# Patient Record
Sex: Female | Born: 1965 | Hispanic: No | Marital: Married | Smoking: Never smoker
Health system: Southern US, Community
[De-identification: ages and names within clinical notes are randomized; demographics above are authoritative.]

## PROBLEM LIST (undated history)

## (undated) DIAGNOSIS — E785 Hyperlipidemia, unspecified: Secondary | ICD-10-CM

## (undated) DIAGNOSIS — E119 Type 2 diabetes mellitus without complications: Secondary | ICD-10-CM

## (undated) DIAGNOSIS — Z9289 Personal history of other medical treatment: Secondary | ICD-10-CM

## (undated) DIAGNOSIS — K219 Gastro-esophageal reflux disease without esophagitis: Secondary | ICD-10-CM

## (undated) DIAGNOSIS — Z8249 Family history of ischemic heart disease and other diseases of the circulatory system: Secondary | ICD-10-CM

## (undated) HISTORY — DX: Type 2 diabetes mellitus without complications: E11.9

## (undated) HISTORY — DX: Personal history of other medical treatment: Z92.89

## (undated) HISTORY — DX: Morbid (severe) obesity due to excess calories: E66.01

## (undated) HISTORY — DX: Gastro-esophageal reflux disease without esophagitis: K21.9

## (undated) HISTORY — DX: Family history of ischemic heart disease and other diseases of the circulatory system: Z82.49

## (undated) HISTORY — DX: Hyperlipidemia, unspecified: E78.5

---

## 2015-12-09 ENCOUNTER — Ambulatory Visit (INDEPENDENT_AMBULATORY_CARE_PROVIDER_SITE_OTHER): Payer: BLUE CROSS/BLUE SHIELD | Admitting: Physician Assistant

## 2015-12-09 VITALS — BP 120/78 | HR 94 | Temp 98.0°F | Resp 18 | Ht 61.0 in | Wt 240.0 lb

## 2015-12-09 DIAGNOSIS — Z131 Encounter for screening for diabetes mellitus: Secondary | ICD-10-CM | POA: Diagnosis not present

## 2015-12-09 DIAGNOSIS — Z1239 Encounter for other screening for malignant neoplasm of breast: Secondary | ICD-10-CM | POA: Diagnosis not present

## 2015-12-09 DIAGNOSIS — Z1389 Encounter for screening for other disorder: Secondary | ICD-10-CM

## 2015-12-09 DIAGNOSIS — Z760 Encounter for issue of repeat prescription: Secondary | ICD-10-CM

## 2015-12-09 DIAGNOSIS — Z130 Encounter for screening for diseases of the blood and blood-forming organs and certain disorders involving the immune mechanism: Secondary | ICD-10-CM

## 2015-12-09 DIAGNOSIS — Z1159 Encounter for screening for other viral diseases: Secondary | ICD-10-CM | POA: Diagnosis not present

## 2015-12-09 DIAGNOSIS — F329 Major depressive disorder, single episode, unspecified: Secondary | ICD-10-CM

## 2015-12-09 DIAGNOSIS — Z1211 Encounter for screening for malignant neoplasm of colon: Secondary | ICD-10-CM

## 2015-12-09 DIAGNOSIS — Z124 Encounter for screening for malignant neoplasm of cervix: Secondary | ICD-10-CM

## 2015-12-09 DIAGNOSIS — Z114 Encounter for screening for human immunodeficiency virus [HIV]: Secondary | ICD-10-CM

## 2015-12-09 DIAGNOSIS — Z13220 Encounter for screening for lipoid disorders: Secondary | ICD-10-CM | POA: Diagnosis not present

## 2015-12-09 DIAGNOSIS — Z0000 Encounter for general adult medical examination without abnormal findings: Secondary | ICD-10-CM

## 2015-12-09 DIAGNOSIS — Z1329 Encounter for screening for other suspected endocrine disorder: Secondary | ICD-10-CM | POA: Diagnosis not present

## 2015-12-09 DIAGNOSIS — F32A Depression, unspecified: Secondary | ICD-10-CM

## 2015-12-09 LAB — POCT WET + KOH PREP
TRICH BY WET PREP: ABSENT
YEAST BY KOH: ABSENT
YEAST BY WET PREP: ABSENT

## 2015-12-09 MED ORDER — OMEPRAZOLE 40 MG PO CPDR: 40.0000 mg | DELAYED_RELEASE_CAPSULE | Freq: Every day | ORAL | Status: DC

## 2015-12-09 MED ORDER — CITALOPRAM HYDROBROMIDE 20 MG PO TABS: 20.0000 mg | ORAL_TABLET | Freq: Every day | ORAL | Status: DC

## 2015-12-09 NOTE — Patient Instructions (Signed)
     IF you received an x-ray today, you will receive an invoice from [facility]. Please contact [facility] at [phone] with questions or concerns regarding your invoice.   IF you received labwork today, you will receive an invoice from [facility]. Please contact [facility] at [phone] with questions or concerns regarding your invoice.   Our billing staff will not be able to assist you with questions regarding bills from these companies.  You will be contacted with the lab results as soon as they are available. The fastest way to get your results is to activate your My Chart account. Instructions are located on the last page of this paperwork. If you have not heard from us regarding the results in 2 weeks, please contact this office.      

## 2015-12-09 NOTE — Progress Notes (Signed)
12/09/2015 7:45 PM   DOB: 11/21/1965 / MRN: [id number]  SUBJECTIVE:  Alexis Stark is a 50 y.o. female presenting for an annual physical.  She has not been receiving regular care.  She denies any problems today and she is really here to receive the discount on her insurance program.    She does complain of anhedonia and dysthymia today. She associates poor concentration, excessive appetite and excessive sleep.  She has been depressed in the past and reports that Celexa helped her greatly with her symptoms.  She denies SI/HI.  She would like to go back on the Celexa.   She would like a refill of omeprazole today.   She has no allergies on file.   She  has no past medical history on file.    She  reports that she has never smoked. She does not have any smokeless tobacco history on file. She  has no sexual activity history on file. The patient  has no past surgical history on file.  Her family history is not on file.  Review of Systems  Constitutional: Negative for fever and chills.  Eyes: Negative for blurred vision.  Respiratory: Negative for cough and shortness of breath.   Cardiovascular: Negative for chest pain.  Gastrointestinal: Negative for nausea and abdominal pain.  Genitourinary: Negative for dysuria, urgency and frequency.  Musculoskeletal: Negative for myalgias.  Skin: Negative for rash.  Neurological: Negative for dizziness, tingling and headaches.  Psychiatric/Behavioral: Negative for depression. The patient is not nervous/anxious.     Problem list and medications reviewed and updated by myself where necessary, and exist elsewhere in the encounter.   OBJECTIVE:  BP 120/78 mmHg  Pulse 94  Temp(Src) 98 F (36.7 C) (Oral)  Resp 18  Ht 5\' 1"  (1.549 m)  Wt 240 lb (108.863 kg)  BMI 45.37 kg/m2  SpO2 97%  LMP 11/25/2015  Physical Exam  Constitutional: She is oriented to person, place, and time. She appears well-nourished. No distress.  She is morbidly obese    Eyes: EOM are normal. Pupils are equal, round, and reactive to light.  Cardiovascular: Normal rate.   Pulmonary/Chest: Effort normal.  Abdominal: She exhibits no distension.  Neurological: She is alert and oriented to person, place, and time. No cranial nerve deficit. Gait normal.  Skin: Skin is dry. She is not diaphoretic.  Psychiatric: She has a normal mood and affect.  Vitals reviewed.   No results found for this or any previous visit (from the past 72 hour(s)).  No results found.  ASSESSMENT AND PLAN  Alexis Stark was seen today for medical clearance.  Diagnoses and all orders for this visit:  Screening, anemia, deficiency, iron  Annual physical exam -     POCT Wet + KOH Prep  Diabetes mellitus screening -     Hemoglobin A1c  Lipid screening -     Lipid panel  Thyroid disorder screen -     TSH  Screening for cervical cancer -     Pap IG, CT/NG w/ reflex HPV when ASC-U  Special screening for malignant neoplasms, colon -     Ambulatory referral to Gastroenterology  Need for hepatitis C screening test -     Hepatitis C antibody  Screening for HIV (human immunodeficiency virus) -     HIV antibody  Screening for breast cancer -     MM DIGITAL SCREENING BILATERAL; Future  Screening for nephropathy -     COMPLETE METABOLIC PANEL WITH GFR  Medication refill -  omeprazole (PRILOSEC) 40 MG capsule; Take 1 capsule (40 mg total) by mouth daily.  Depression (emotion) -     citalopram (CELEXA) 20 MG tablet; Take 1 tablet (20 mg total) by mouth daily.    The patient was advised to call or return to clinic if she does not see an improvement in symptoms or to seek the care of the closest emergency department if she worsens with the above plan.   Deliah BostonMichael Clark, MHS, PA-C [facility] 12/09/2015 7:45 PM

## 2015-12-10 LAB — COMPLETE METABOLIC PANEL WITH GFR
ALT: 12 U/L (ref 6–29)
AST: 10 U/L (ref 10–35)
Albumin: 3.6 g/dL (ref 3.6–5.1)
Alkaline Phosphatase: 79 U/L (ref 33–130)
BILIRUBIN TOTAL: 0.2 mg/dL (ref 0.2–1.2)
BUN: 17 mg/dL (ref 7–25)
CO2: 24 mmol/L (ref 20–31)
CREATININE: 0.76 mg/dL (ref 0.50–1.05)
Calcium: 9.2 mg/dL (ref 8.6–10.4)
Chloride: 101 mmol/L (ref 98–110)
GFR, Est African American: >89 mL/min (ref >=60)
GFR, Est Non African American: >89 mL/min (ref >=60)
GLUCOSE: 157 mg/dL — AB (ref 65–99)
Potassium: 4.4 mmol/L (ref 3.5–5.3)
SODIUM: 136 mmol/L (ref 135–146)
TOTAL PROTEIN: 6.7 g/dL (ref 6.1–8.1)

## 2015-12-10 LAB — LIPID PANEL
CHOL/HDL RATIO: 4 ratio (ref <=5.0)
Cholesterol: 242 mg/dL — ABNORMAL HIGH (ref 125–200)
HDL: 61 mg/dL (ref >=46)
LDL CALC: 146 mg/dL — AB (ref <130)
TRIGLYCERIDES: 176 mg/dL — AB (ref <150)
VLDL: 35 mg/dL — ABNORMAL HIGH (ref <30)

## 2015-12-10 LAB — TSH: TSH: 1.37 mIU/L

## 2015-12-10 LAB — HIV ANTIBODY (ROUTINE TESTING W REFLEX): HIV 1&2 Ab, 4th Generation: NONREACTIVE

## 2015-12-10 LAB — HEPATITIS C ANTIBODY: HCV Ab: NEGATIVE

## 2015-12-11 LAB — HEMOGLOBIN A1C
HEMOGLOBIN A1C: 6.4 % — AB (ref <5.7)
Mean Plasma Glucose: 137 mg/dL

## 2015-12-11 LAB — PAP IG, CT-NG, RFX HPV ASCU
CHLAMYDIA PROBE AMP: NOT DETECTED
GC Probe Amp: NOT DETECTED

## 2015-12-16 ENCOUNTER — Telehealth: Payer: Self-pay | Admitting: *Deleted

## 2015-12-16 NOTE — Telephone Encounter (Signed)
Patient needs to return to see Deliah BostonMichael Clark to discuss medications .   See note results

## 2015-12-23 ENCOUNTER — Encounter: Payer: Self-pay | Admitting: *Deleted

## 2015-12-24 ENCOUNTER — Telehealth: Payer: Self-pay

## 2015-12-24 NOTE — Telephone Encounter (Signed)
-----   Message from Ofilia NeasMichael L Clark, PA-C sent at 12/11/2015  8:26 AM EDT ----- Please call her and advise that she needs to return to clinic to see me.  We need to start a few medications for blood sugar and cholesterol and it would be best that she return to clinic so we can discuss what is going on. Deliah BostonMichael Clark, MS, PA-C 8:25 AM, 12/11/2015

## 2015-12-24 NOTE — Telephone Encounter (Signed)
LMOVM - pt will receive 2 letters.  First one is all the lab results - 2nd asks that she return to clinic to discuss sugar and cholesterol results and medicines for those with Deliah BostonMichael Clark.  Letter sent addressing RTC.

## 2016-01-11 ENCOUNTER — Encounter: Payer: Self-pay | Admitting: Physician Assistant

## 2016-05-12 ENCOUNTER — Ambulatory Visit (INDEPENDENT_AMBULATORY_CARE_PROVIDER_SITE_OTHER): Payer: BLUE CROSS/BLUE SHIELD | Admitting: Physician Assistant

## 2016-05-12 VITALS — BP 126/80 | HR 91 | Temp 98.4°F | Resp 18 | Ht 61.0 in | Wt 239.0 lb

## 2016-05-12 DIAGNOSIS — G8929 Other chronic pain: Secondary | ICD-10-CM

## 2016-05-12 DIAGNOSIS — Z23 Encounter for immunization: Secondary | ICD-10-CM | POA: Diagnosis not present

## 2016-05-12 DIAGNOSIS — M545 Low back pain: Secondary | ICD-10-CM | POA: Diagnosis not present

## 2016-05-12 DIAGNOSIS — Z1231 Encounter for screening mammogram for malignant neoplasm of breast: Secondary | ICD-10-CM

## 2016-05-12 DIAGNOSIS — E7889 Other lipoprotein metabolism disorders: Secondary | ICD-10-CM

## 2016-05-12 DIAGNOSIS — Z1211 Encounter for screening for malignant neoplasm of colon: Secondary | ICD-10-CM

## 2016-05-12 DIAGNOSIS — R739 Hyperglycemia, unspecified: Secondary | ICD-10-CM | POA: Diagnosis not present

## 2016-05-12 NOTE — Patient Instructions (Addendum)
For back pain take 1000 mg of Tylenol every 8 hours.      IF you received an x-ray today, you will receive an invoice from [facility]. Please contact [facility] at [phone] with questions or concerns regarding your invoice.   IF you received labwork today, you will receive an invoice from [facility]. Please contact [facility] at [phone] with questions or concerns regarding your invoice.   Our billing staff will not be able to assist you with questions regarding bills from these companies.  You will be contacted with the lab results as soon as they are available. The fastest way to get your results is to activate your My Chart account. Instructions are located on the last page of this paperwork. If you have not heard from us regarding the results in 2 weeks, please contact this office.

## 2016-05-12 NOTE — Progress Notes (Signed)
05/12/2016 5:43 PM   DOB: 06/05/1966 / MRN: [id number] [id number]  SUBJECTIVE:  Alexis Stark is a 50 y.o. female presenting for low back pain that is chornic in nature.  Has been using lidocaine patches with good relief and would like script for this.    [place] the [place] 6 weeks and per my last exam she is in need of a colonoscopy and mamogram.  She would like these referrals placed today. She would like to test her A1C and lipids again before starting medication today.   She has no allergies on file.   She  has no past medical history on file.    She  reports that she has never smoked. She has never used smokeless tobacco. She reports that she does not drink alcohol or use drugs. She  has no sexual activity history on file. The patient  has no past surgical history on file.  Her family history is not on file.  Review of Systems  Constitutional: Negative for chills and fever.  Respiratory: Negative for cough and wheezing.   Cardiovascular: Negative for chest pain.  Gastrointestinal: Negative for nausea.  Skin: Negative for itching and rash.  Neurological: Negative for dizziness.    The problem list and medications were reviewed and updated by myself where necessary and exist elsewhere in the encounter.   OBJECTIVE:  BP 126/80 (BP Location: Right Arm, Patient Position: Sitting, Cuff Size: Small)   Pulse 91   Temp 98.4 F (36.9 C) (Oral)   Resp 18   Ht 5\' 1"  (1.549 m)   Wt 239 lb (108.4 kg)   LMP 04/28/2016   SpO2 96%   BMI 45.16 kg/m   Physical Exam  Constitutional: She is oriented to person, place, and time. She appears well-developed and well-nourished. No distress.  Cardiovascular: Normal rate, regular rhythm and normal heart sounds.   Pulmonary/Chest: Effort normal and breath sounds normal. She has no wheezes.  Musculoskeletal: Normal range of motion. She exhibits no edema, tenderness or deformity.  Neurological: She is alert and  oriented to person, place, and time.  Skin: Skin is warm and dry.  Psychiatric: She has a normal mood and affect.    No results found for this or any previous visit (from the past 72 hour(s)).  No results found.  ASSESSMENT AND PLAN  Alexis Stark was seen today for follow-up.  Diagnoses and all orders for this visit:  Elevated blood sugar: will retest.  Pred not an options for her back and given diabetic status would like to avoid NSAIDs altogether.  Prilocaine will not be covered for chronic back pain.  Advised she continue OTC.  -     Hemoglobin A1c  Lipids abnormal -     Lipid panel  Special screening for malignant neoplasms, colon -     Ambulatory referral to Gastroenterology  Need for prophylactic vaccination and inoculation against influenza -     Flu Vaccine QUAD 36+ mos IM  Encounter for screening mammogram for malignant neoplasm of breast -     MM DIGITAL SCREENING BILATERAL; Future  Chronic midline low back pain without sciatica: She is wanting lidocaine patches.  Advised that insurance will not cover this.  She understands and is okay to continue purchasing OTC.  Advised tylenol 1000 mg q8.     The patient is advised to call or return to clinic if she does not see an improvement in symptoms, or to seek the care of  the closest emergency department if she worsens with the above plan.   Deliah BostonMichael Kasten Leveque, MHS, PA-C [facility] 05/12/2016 5:43 PM

## 2016-05-13 LAB — LIPID PANEL
CHOL/HDL RATIO: 4.3 ratio (ref <5.0)
Cholesterol: 234 mg/dL — ABNORMAL HIGH (ref <200)
HDL: 54 mg/dL (ref >50)
LDL Cholesterol: 152 mg/dL — ABNORMAL HIGH (ref <100)
TRIGLYCERIDES: 139 mg/dL (ref <150)
VLDL: 28 mg/dL (ref <30)

## 2016-05-14 LAB — HEMOGLOBIN A1C
HEMOGLOBIN A1C: 6.5 % — AB (ref <5.7)
MEAN PLASMA GLUCOSE: 140 mg/dL

## 2016-05-16 NOTE — Progress Notes (Signed)
I need to see her back in clinic.  She is a diabetic and her labs warrant treatment. Please advise that she schedule with me at 104 so we can plan to start her on medicition. Deliah BostonMichael Clark, MS, PA-C 7:50 PM, 05/16/2016

## 2016-05-21 ENCOUNTER — Encounter: Payer: Self-pay | Admitting: *Deleted

## 2016-06-02 ENCOUNTER — Ambulatory Visit: Payer: BLUE CROSS/BLUE SHIELD | Admitting: Physician Assistant

## 2016-06-16 ENCOUNTER — Encounter: Payer: Self-pay | Admitting: Physician Assistant

## 2016-12-09 ENCOUNTER — Telehealth: Payer: Self-pay | Admitting: Family Medicine

## 2016-12-09 ENCOUNTER — Encounter: Payer: Self-pay | Admitting: Family Medicine

## 2016-12-09 ENCOUNTER — Ambulatory Visit (INDEPENDENT_AMBULATORY_CARE_PROVIDER_SITE_OTHER): Payer: No Typology Code available for payment source | Admitting: Family Medicine

## 2016-12-09 VITALS — BP 122/88 | HR 92 | Temp 98.1°F | Resp 16 | Ht 61.0 in | Wt 251.4 lb

## 2016-12-09 DIAGNOSIS — E785 Hyperlipidemia, unspecified: Secondary | ICD-10-CM

## 2016-12-09 DIAGNOSIS — L509 Urticaria, unspecified: Secondary | ICD-10-CM | POA: Diagnosis not present

## 2016-12-09 DIAGNOSIS — K219 Gastro-esophageal reflux disease without esophagitis: Secondary | ICD-10-CM

## 2016-12-09 DIAGNOSIS — J302 Other seasonal allergic rhinitis: Secondary | ICD-10-CM | POA: Insufficient documentation

## 2016-12-09 DIAGNOSIS — Z1231 Encounter for screening mammogram for malignant neoplasm of breast: Secondary | ICD-10-CM | POA: Diagnosis not present

## 2016-12-09 DIAGNOSIS — E119 Type 2 diabetes mellitus without complications: Secondary | ICD-10-CM

## 2016-12-09 DIAGNOSIS — Z8249 Family history of ischemic heart disease and other diseases of the circulatory system: Secondary | ICD-10-CM | POA: Diagnosis not present

## 2016-12-09 DIAGNOSIS — Z23 Encounter for immunization: Secondary | ICD-10-CM

## 2016-12-09 DIAGNOSIS — F341 Dysthymic disorder: Secondary | ICD-10-CM | POA: Insufficient documentation

## 2016-12-09 DIAGNOSIS — R0789 Other chest pain: Secondary | ICD-10-CM | POA: Insufficient documentation

## 2016-12-09 DIAGNOSIS — Z1239 Encounter for other screening for malignant neoplasm of breast: Secondary | ICD-10-CM | POA: Insufficient documentation

## 2016-12-09 DIAGNOSIS — Z789 Other specified health status: Secondary | ICD-10-CM | POA: Diagnosis not present

## 2016-12-09 HISTORY — DX: Type 2 diabetes mellitus without complications: E11.9

## 2016-12-09 HISTORY — DX: Family history of ischemic heart disease and other diseases of the circulatory system: Z82.49

## 2016-12-09 HISTORY — DX: Gastro-esophageal reflux disease without esophagitis: K21.9

## 2016-12-09 HISTORY — DX: Hyperlipidemia, unspecified: E78.5

## 2016-12-09 HISTORY — DX: Morbid (severe) obesity due to excess calories: E66.01

## 2016-12-09 LAB — POCT GLYCOSYLATED HEMOGLOBIN (HGB A1C): HEMOGLOBIN A1C: 6.6

## 2016-12-09 MED ORDER — CITALOPRAM HYDROBROMIDE 20 MG PO TABS: 20.0000 mg | ORAL_TABLET | Freq: Every day | ORAL | 1 refills | Status: DC

## 2016-12-09 MED ORDER — BUPROPION HCL ER (XL) 150 MG PO TB24: 150.0000 mg | ORAL_TABLET | Freq: Every day | ORAL | 1 refills | Status: DC

## 2016-12-09 MED ORDER — ASPIRIN EC 81 MG PO TBEC: 81.0000 mg | DELAYED_RELEASE_TABLET | Freq: Every day | ORAL | 11 refills | Status: DC

## 2016-12-09 MED ORDER — LISINOPRIL 5 MG PO TABS: 5.0000 mg | ORAL_TABLET | Freq: Every day | ORAL | 0 refills | Status: DC

## 2016-12-09 MED ORDER — OMEPRAZOLE 40 MG PO CPDR: 40.0000 mg | DELAYED_RELEASE_CAPSULE | Freq: Every day | ORAL | 3 refills | Status: DC

## 2016-12-09 MED ORDER — ESOMEPRAZOLE MAGNESIUM 40 MG PO CPDR: 40.0000 mg | DELAYED_RELEASE_CAPSULE | Freq: Every day | ORAL | 0 refills | Status: DC

## 2016-12-09 MED ORDER — METFORMIN HCL 500 MG PO TABS: 500.0000 mg | ORAL_TABLET | Freq: Every day | ORAL | 0 refills | Status: DC

## 2016-12-09 NOTE — Assessment & Plan Note (Signed)
tdap and pneumovax given

## 2016-12-09 NOTE — Assessment & Plan Note (Signed)
Referred to Cardiology for echo. Also checked lipid today. Cut down caffeine. Started asa

## 2016-12-09 NOTE — Assessment & Plan Note (Addendum)
Discussed that she should decrease her caffeine intake and try to lose weight.  Will send her for endoscopy as it is important to rule out gastritis which can cloud the picture regarding her substernal chest pain.  Changed omeprazole to esomprazole

## 2016-12-09 NOTE — Telephone Encounter (Signed)
Pt just saw Creta LevinStallings and she prescribed Nexiium and this is not covered by her insurance and she needs something else called in   Best number [phone]

## 2016-12-09 NOTE — Telephone Encounter (Signed)
Let her know that I sent in omeprazole which she was already on.  She may have to buy nexium out of pocket. She can also get a coupon on goodrx.com

## 2016-12-09 NOTE — Assessment & Plan Note (Signed)
Referral to diabetic nutrition placed.  Discussed a 5 pound weight loss in the next month by eating more frequent meals. Adding on more proteins like fish.

## 2016-12-09 NOTE — Patient Instructions (Signed)
     IF you received an x-ray today, you will receive an invoice from [facility]. Please contact [facility] at [phone] with questions or concerns regarding your invoice.   IF you received labwork today, you will receive an invoice from [facility]. Please contact [facility] at [phone] with questions or concerns regarding your invoice.   Our billing staff will not be able to assist you with questions regarding bills from these companies.  You will be contacted with the lab results as soon as they are available. The fastest way to get your results is to activate your My Chart account. Instructions are located on the last page of this paperwork. If you have not heard from us regarding the results in 2 weeks, please contact this office.    We recommend that you schedule a mammogram for breast cancer screening. Typically, you do not need a referral to do this. Please contact a local imaging center to schedule your mammogram.  [facility] - [phone]  *ask for the Radiology Department [facility]) - [phone] or [phone]  [facility] - [phone] [facility] - [phone] [facility]  - [phone]  *ask for the Radiology Department [facility] - [phone]  *ask for the Radiology Department [facility] - [phone]  *ask for the Mammography Department [facility] - [phone] 

## 2016-12-09 NOTE — Assessment & Plan Note (Signed)
Placed referral to allergy as pt also has progressive hives

## 2016-12-09 NOTE — Progress Notes (Signed)
Chief Complaint  Patient presents with  . 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  She is here today for medication refills before getting on a plane to [place] in 3 days. She has multiple medical problems today.  GERD: Paitent complains of heartburn. This has been associated with heartburn.  She denies belching, bilious reflux, choking on food and deep pressure at base of neck. Symptoms have been present for a few months. She denies dysphagia.  She has not lost weight. She denies melena, hematochezia, hematemesis, and coffee ground emesis. Medical therapy in the past has included proton pump inhibitors.  Diabetes Mellitus Type II, Initial Visit: Patient here for an initial evaluation of Type 2 diabetes mellitus.  She was tested November 2017 with an a1c of 6.5%. She received a letter notifying her of her diabetes but she did not address it.  She reports that she drinks at least 88 oz of coffee at home and at work a day.  She works nights as a Engineer, civil (consulting) from 11pm to 7am.  She eats one meal a day and typically overeats.  Since observing Ramadan she has gained 10 pounds around her waistline.  Current symptoms/problems include increase appetite and polyuria and have been unchanged.   The patient was initially diagnosed with Type 2 diabetes mellitus based on the following criteria:  a1c >6.5% May 12, 2016. Lab Results  Component Value Date   HGBA1C 6.6 12/09/2016     Known diabetic complications: none Cardiovascular risk factors: diabetes mellitus, dyslipidemia, family history of premature cardiovascular disease, obesity (BMI >= 30 kg/m2) and sedentary lifestyle Current diabetic medications include none.   Eye exam current (within one year): no Weight trend: increasing steadily Prior visit with dietician: no Current diet: mostly eats bread and eats one meal a day Current  exercise: none  Current monitoring regimen: none  Is She on ACE inhibitor or angiotensin II receptor blocker?  No    Morbid Obesity and Excessive Caffeine intake She reports that she drinks at least 88 oz of coffee at home and at work a day.  She works nights as a Engineer, civil (consulting) from 11pm to 7am.  She eats one meal a day and typically overeats.  Since observing Ramadan she has gained 10 pounds around her waistline. She states that she would like to exercise but usually just does stretches once a week.   Depression She reports that she was started on Celexa which helped her mood but caused low libido.  She states that she took it daily. She has no known side effects when taken with Omeprazole Depression screen [facility] 2/9 12/09/2016 12/09/2016 05/12/2016  Decreased Interest 2 0 0  Down, Depressed, Hopeless 0 0 0  PHQ - 2 Score 2 0 0  Altered sleeping 0 - -  Tired, decreased energy 3 - -  Change in appetite 3 - -  Feeling bad or failure about yourself  0 - -  Trouble concentrating 0 - -  Moving slowly or fidgety/restless 0 - -  Suicidal thoughts 0 - -  PHQ-9 Score 8 - -    Family history of aortic dissection Pt reports that 3 years ago her sister died after her "aorta exploded in her chest". She reports that this happened in [place].  She states that her sister's autopsy showed uncontrolled hypertension and diabetes.  She reports that her father had diabetes at age 48 and she also has a strong family history of high cholesterol.  Allergies and Hives Pt reports that she takes benadryl for her allergies. She saw an allergist years ago for testing. She has not been able to get her allergies under control.  Skin testing did not reveal a specific cause of her hives.  She has not seen any difference with the elimination diet. It seems to be that she gets hives from bread and meat.      No past medical history on file.  Current Outpatient Prescriptions  Medication Sig Dispense Refill  .  diphenhydrAMINE (BENADRYL) 25 MG tablet Take 25 mg by mouth every 6 (six) hours as needed.    Marland Kitchen aspirin EC 81 MG tablet Take 1 tablet (81 mg total) by mouth daily. 30 tablet 11  . buPROPion (WELLBUTRIN XL) 150 MG 24 hr tablet Take 1 tablet (150 mg total) by mouth daily. 30 tablet 1  . esomeprazole (NEXIUM) 40 MG capsule Take 1 capsule (40 mg total) by mouth daily at 12 noon. 90 capsule 0  . lisinopril (PRINIVIL,ZESTRIL) 5 MG tablet Take 1 tablet (5 mg total) by mouth daily. 30 tablet 0  . metFORMIN (GLUCOPHAGE) 500 MG tablet Take 1 tablet (500 mg total) by mouth daily. With your dinner since you work nights 30 tablet 0   No current facility-administered medications for this visit.     Allergies: No Known Allergies  No past surgical history on file.  Social History   Social History  . Marital status: Married    Spouse name: N/A  . Number of children: N/A  . Years of education: N/A   Social History Main Topics  . Smoking status: Never Smoker  . Smokeless tobacco: Never Used  . Alcohol use No  . Drug use: No  . Sexual activity: Not Asked   Other Topics Concern  . None   Social History Narrative  . None    ROS  Objective: Vitals:   12/09/16 0925  BP: 122/88  Pulse: 92  Resp: 16  Temp: 98.1 F (36.7 C)  TempSrc: Oral  SpO2: 97%  Weight: 251 lb 6.4 oz (114 kg)  Height: 5\' 1"  (1.549 m)    Physical Exam  Constitutional: She is oriented to person, place, and time. She appears well-developed and well-nourished.  Morbidly obese   HENT:  Head: Normocephalic and atraumatic.  Right Ear: External ear normal.  Left Ear: External ear normal.  Mouth/Throat: Oropharynx is clear and moist.  Eyes: Conjunctivae and EOM are normal.  Neck: Normal range of motion. Neck supple.  Cardiovascular: Normal rate, regular rhythm and normal heart sounds.   No murmur heard. With palpation of the sternum, chest pain is not reproducible  Pulmonary/Chest: Effort normal and breath sounds  normal. No respiratory distress. She has no wheezes.  Abdominal: Soft. Bowel sounds are normal. She exhibits no distension and no mass. There is no tenderness. There is no guarding.  Large abdominal girth  Neurological: She is alert and oriented to person, place, and time.  Skin: Skin is warm. Capillary refill takes less than 2 seconds.  Psychiatric: She has a normal mood and affect. Her behavior is normal. Judgment and thought content normal.   Diabetic Foot Exam - Simple   Simple Foot Form Diabetic Foot exam was performed with the following findings:  Yes 12/09/2016 10:28 AM  Visual Inspection No deformities, no ulcerations, no other skin breakdown bilaterally:  Yes Sensation Testing Intact to touch and monofilament testing bilaterally:  Yes Pulse Check Posterior Tibialis and Dorsalis pulse intact bilaterally:  Yes Comments      ECG interpretation ECG without st elevation, no twi, normal sinus rhythm  Assessment and Plan  Problem List Items Addressed This Visit      Respiratory   Seasonal allergic rhinitis    Placed referral to allergy as pt also has progressive hives        Digestive   Gastroesophageal reflux disease    Discussed that she should decrease her caffeine intake and try to lose weight.  Will send her for endoscopy as it is important to rule out gastritis which can cloud the picture regarding her substernal chest pain.  Changed omeprazole to esomprazole      Relevant Medications   esomeprazole (NEXIUM) 40 MG capsule   Other Relevant Orders   Ambulatory referral to Gastroenterology     Endocrine   Newly diagnosed diabetes [facility](HCC) - Primary    Discussed her new diagnosis and standard of care Performed foot exam, checked renal function, repeated a1c and still at goal, referred to nutrition,  Started low dose lisinopril for renal protection Aspirin and low dose statin Also discussed importance of increasing her protein and consuming smaller more frequent  meals Will add on metformin once daily      Relevant Medications   lisinopril (PRINIVIL,ZESTRIL) 5 MG tablet   aspirin EC 81 MG tablet   metFORMIN (GLUCOPHAGE) 500 MG tablet   Other Relevant Orders   POCT glycosylated hemoglobin (Hb A1C) (Completed)   Microalbumin, urine   Lipid panel   Ambulatory referral to Cardiology   Ambulatory referral to diabetic education     Other   Morbid obesity (HCC)    Referral to diabetic nutrition placed.  Discussed a 5 pound weight loss in the next month by eating more frequent meals. Adding on more proteins like fish.       Relevant Medications   metFORMIN (GLUCOPHAGE) 500 MG tablet   Other Relevant Orders   Lipid panel   Comprehensive metabolic panel   TSH   Dyslipidemia    Will await liver function panel. If within normal limits will discuss starting a statin lowering drug.  Her last lipid panel was abnormal but there is no liver function test.       Relevant Orders   Lipid panel   Comprehensive metabolic panel   TSH   Consumes more than eight servings of caffeine per day    Discussed that she should titrate down her caffeine intake. Discussed that her low energy is a manifestation of her diabetes and deconditioning.  Advised her to add decaf and try to mix in more decaf slowly so that she does not go through withdrawal.       Dysthymia    Changed celexa since there is an association with low libido Will try wellbutrin to help with mood and decrease her weight gain profile.       Relevant Medications   buPROPion (WELLBUTRIN XL) 150 MG 24 hr tablet   Chest pain, midsternal    ecg currently normal. With concommitant GERD will refer to GI for egd and added nexium Also added aspirin because there is concern about cardiac causes Referred to Cardiology Also discussed that with her family history of aortic dissection she should cut back on her caffeine, start an aspirin and follow up with Cardiology for Echo      Relevant Orders  EKG  12-Lead (Completed)   Ambulatory referral to Cardiology   Family history of aortic dissection    Referred to Cardiology for echo. Also checked lipid today. Cut down caffeine. Started asa      Relevant Orders   EKG 12-Lead (Completed)   Ambulatory referral to Cardiology   Need for vaccination    tdap and pneumovax given      Screening for breast cancer    Set up for [facility]m mammogram       Relevant Orders   [facility] (Completed)        Alexis Stark

## 2016-12-09 NOTE — Assessment & Plan Note (Signed)
ecg currently normal. With concommitant GERD will refer to GI for egd and added nexium Also added aspirin because there is concern about cardiac causes Referred to Cardiology Also discussed that with her family history of aortic dissection she should cut back on her caffeine, start an aspirin and follow up with Cardiology for Echo

## 2016-12-09 NOTE — Telephone Encounter (Signed)
Left message on patient's VM stating that rx was sent to pharmacy and if she had any questions to call us back.

## 2016-12-09 NOTE — Assessment & Plan Note (Signed)
Will await liver function panel. If within normal limits will discuss starting a statin lowering drug.  Her last lipid panel was abnormal but there is no liver function test.

## 2016-12-09 NOTE — Assessment & Plan Note (Signed)
Set up for [facility]m mammogram

## 2016-12-09 NOTE — Assessment & Plan Note (Signed)
Discussed her new diagnosis and standard of care Performed foot exam, checked renal function, repeated a1c and still at goal, referred to nutrition,  Started low dose lisinopril for renal protection Aspirin and low dose statin Also discussed importance of increasing her protein and consuming smaller more frequent meals Will add on metformin once daily

## 2016-12-09 NOTE — Assessment & Plan Note (Signed)
Changed celexa since there is an association with low libido Will try wellbutrin to help with mood and decrease her weight gain profile.

## 2016-12-09 NOTE — Assessment & Plan Note (Signed)
Discussed that she should titrate down her caffeine intake. Discussed that her low energy is a manifestation of her diabetes and deconditioning.  Advised her to add decaf and try to mix in more decaf slowly so that she does not go through withdrawal.

## 2016-12-10 LAB — COMPREHENSIVE METABOLIC PANEL
A/G RATIO: 1.4 (ref 1.2–2.2)
ALT: 10 IU/L (ref 0–32)
AST: 10 IU/L (ref 0–40)
Albumin: 3.7 g/dL (ref 3.5–5.5)
Alkaline Phosphatase: 96 IU/L (ref 39–117)
BUN/Creatinine Ratio: 19 (ref 9–23)
BUN: 11 mg/dL (ref 6–24)
Bilirubin Total: <0.2 mg/dL (ref 0.0–1.2)
CALCIUM: 9.2 mg/dL (ref 8.7–10.2)
CO2: 24 mmol/L (ref 20–29)
CREATININE: 0.58 mg/dL (ref 0.57–1.00)
Chloride: 103 mmol/L (ref 96–106)
GFR, EST AFRICAN AMERICAN: 123 mL/min/{1.73_m2} (ref >59)
GFR, EST NON AFRICAN AMERICAN: 106 mL/min/{1.73_m2} (ref >59)
GLOBULIN, TOTAL: 2.7 g/dL (ref 1.5–4.5)
Glucose: 106 mg/dL — ABNORMAL HIGH (ref 65–99)
POTASSIUM: 4.6 mmol/L (ref 3.5–5.2)
SODIUM: 141 mmol/L (ref 134–144)
TOTAL PROTEIN: 6.4 g/dL (ref 6.0–8.5)

## 2016-12-10 LAB — LIPID PANEL
CHOL/HDL RATIO: 4.2 ratio (ref 0.0–4.4)
CHOLESTEROL TOTAL: 226 mg/dL — AB (ref 100–199)
HDL: 54 mg/dL (ref >39)
LDL CALC: 137 mg/dL — AB (ref 0–99)
TRIGLYCERIDES: 173 mg/dL — AB (ref 0–149)
VLDL CHOLESTEROL CAL: 35 mg/dL (ref 5–40)

## 2016-12-10 LAB — TSH: TSH: 1.66 u[IU]/mL (ref 0.450–4.500)

## 2016-12-10 LAB — MICROALBUMIN, URINE: Microalbumin, Urine: 8.7 ug/mL

## 2016-12-21 NOTE — Progress Notes (Signed)
Lm on vm to return office call (Delores) dg

## 2016-12-21 NOTE — Progress Notes (Signed)
Left message on voicemail to return office call (Delores). dg 

## 2016-12-27 ENCOUNTER — Encounter: Payer: Self-pay | Admitting: Radiology

## 2017-01-11 ENCOUNTER — Ambulatory Visit (INDEPENDENT_AMBULATORY_CARE_PROVIDER_SITE_OTHER): Payer: No Typology Code available for payment source | Admitting: Family Medicine

## 2017-01-11 ENCOUNTER — Encounter: Payer: Self-pay | Admitting: Family Medicine

## 2017-01-11 VITALS — BP 135/86 | HR 98 | Temp 98.5°F | Resp 16 | Ht 61.0 in | Wt 244.2 lb

## 2017-01-11 DIAGNOSIS — E119 Type 2 diabetes mellitus without complications: Secondary | ICD-10-CM

## 2017-01-11 DIAGNOSIS — F341 Dysthymic disorder: Secondary | ICD-10-CM | POA: Diagnosis not present

## 2017-01-11 DIAGNOSIS — K219 Gastro-esophageal reflux disease without esophagitis: Secondary | ICD-10-CM | POA: Diagnosis not present

## 2017-01-11 MED ORDER — BUPROPION HCL ER (XL) 150 MG PO TB24: 150.0000 mg | ORAL_TABLET | Freq: Every day | ORAL | 1 refills | Status: DC

## 2017-01-11 MED ORDER — LISINOPRIL 5 MG PO TABS: 5.0000 mg | ORAL_TABLET | Freq: Every day | ORAL | 1 refills | Status: DC

## 2017-01-11 MED ORDER — OMEPRAZOLE 40 MG PO CPDR: 40.0000 mg | DELAYED_RELEASE_CAPSULE | Freq: Every day | ORAL | 1 refills | Status: DC

## 2017-01-11 MED ORDER — METFORMIN HCL 500 MG PO TABS: 500.0000 mg | ORAL_TABLET | Freq: Every day | ORAL | 1 refills | Status: DC

## 2017-01-11 NOTE — Progress Notes (Signed)
Chief Complaint  Patient presents with  . Diabetes    f/u and labs    HPI   Diabetes Mellitus: Patient presents for follow up of diabetes. She recently returned from [place] where she was exercising more and consuming a better diet.  She reports that she has been drinking less sugary coffee and cutting back on the bread She denies hypoglycemia She has cut down on bread and her heavy coffee intake  She is tolerating the metformin well without any diarrhea Wt Readings from Last 3 Encounters:  01/11/17 244 lb 3.2 oz (110.8 kg)  12/09/16 251 lb 6.4 oz (114 kg)  05/12/16 239 lb (108.4 kg)    Hives While back home in [place] She states that she was eating back home and did not eat chicken much because she was eating lamb, goat and beef She reports that she ate chicken and had hives She thinks the hives might be due to chicken and [place]  Dysthymia She reports that the wellbutrin is still helping her. States that she continue to try to increase exercise. She does not have suicidal thoughts.  GERD She reports that she was unable to get nexium covered by insurance She resumed omeprazole  History reviewed. No pertinent past medical history.  Current Outpatient Prescriptions  Medication Sig Dispense Refill  . aspirin EC 81 MG tablet Take 1 tablet (81 mg total) by mouth daily. 30 tablet 11  . buPROPion (WELLBUTRIN XL) 150 MG 24 hr tablet Take 1 tablet (150 mg total) by mouth daily. 90 tablet 1  . diphenhydrAMINE (BENADRYL) 25 MG tablet Take 25 mg by mouth every 6 (six) hours as needed.    Marland Kitchen. lisinopril (PRINIVIL,ZESTRIL) 5 MG tablet Take 1 tablet (5 mg total) by mouth daily. 90 tablet 1  . metFORMIN (GLUCOPHAGE) 500 MG tablet Take 1 tablet (500 mg total) by mouth daily. With your dinner since you work nights 90 tablet 1  . omeprazole (PRILOSEC) 40 MG capsule Take 1 capsule (40 mg total) by mouth daily. 90 capsule 1   No current facility-administered medications for this visit.      Allergies: No Known Allergies  History reviewed. No pertinent surgical history.  Social History   Social History  . Marital status: Married    Spouse name: N/A  . Number of children: N/A  . Years of education: N/A   Social History Main Topics  . Smoking status: Never Smoker  . Smokeless tobacco: Never Used  . Alcohol use No  . Drug use: No  . Sexual activity: Not Asked   Other Topics Concern  . None   Social History Narrative  . None    ROS Review of Systems See HPI Constitution: No fevers or chills No malaise No diaphoresis Skin: No rash or itching Eyes: no blurry vision, no double vision GU: no dysuria or hematuria Neuro: no dizziness or headaches  Objective: Vitals:   01/11/17 1048  BP: 135/86  Pulse: 98  Resp: 16  Temp: 98.5 F (36.9 C)  TempSrc: Oral  SpO2: 96%  Weight: 244 lb 3.2 oz (110.8 kg)  Height: 5\' 1"  (1.549 m)   Wt Readings from Last 3 Encounters:  01/11/17 244 lb 3.2 oz (110.8 kg)  12/09/16 251 lb 6.4 oz (114 kg)  05/12/16 239 lb (108.4 kg)     Physical Exam  Constitutional: She is oriented to person, place, and time. She appears well-developed and well-nourished.  HENT:  Head: Normocephalic and atraumatic.  Eyes: Conjunctivae and EOM  are normal.  Cardiovascular: Normal rate, regular rhythm and normal heart sounds.   Pulmonary/Chest: Effort normal and breath sounds normal. No respiratory distress. She has no wheezes. She has no rales.  Neurological: She is alert and oriented to person, place, and time.  Skin: Skin is warm. Capillary refill takes less than 2 seconds. No erythema.     Assessment and Plan Alexis Stark was seen today for diabetes.  Diagnoses and all orders for this visit:  Newly diagnosed diabetes (HCC)- improving with medication and in changing sugar intake  Dysthymia- improved on wellbutrin  Morbid obesity (HCC)- improved with dietary changes and metformin  Gastroesophageal reflux disease, esophagitis  presence not specified- continue omeprazole  Other orders -     Cancel: POCT glycosylated hemoglobin (Hb A1C) -     Cancel: POCT urinalysis dipstick -     lisinopril (PRINIVIL,ZESTRIL) 5 MG tablet; Take 1 tablet (5 mg total) by mouth daily. -     buPROPion (WELLBUTRIN XL) 150 MG 24 hr tablet; Take 1 tablet (150 mg total) by mouth daily. -     metFORMIN (GLUCOPHAGE) 500 MG tablet; Take 1 tablet (500 mg total) by mouth daily. With your dinner since you work nights -     omeprazole (PRILOSEC) 40 MG capsule; Take 1 capsule (40 mg total) by mouth daily.     Alexis Stark A Alexis Stark

## 2017-01-11 NOTE — Patient Instructions (Addendum)
   IF you received an x-ray today, you will receive an invoice from [facility]. Please contact [facility] at [phone] with questions or concerns regarding your invoice.   IF you received labwork today, you will receive an invoice from [facility]. Please contact [facility] at [phone] with questions or concerns regarding your invoice.   Our billing staff will not be able to assist you with questions regarding bills from these companies.  You will be contacted with the lab results as soon as they are available. The fastest way to get your results is to activate your My Chart account. Instructions are located on the last page of this paperwork. If you have not heard from us regarding the results in 2 weeks, please contact this office.      Diabetes Mellitus and Standards of Medical Care Managing diabetes (diabetes mellitus) can be complicated. Your diabetes treatment may be managed by a team of health care providers, including:  A diet and nutrition specialist (registered dietitian).  A nurse.  A certified diabetes educator (CDE).  A diabetes specialist (endocrinologist).  An eye doctor.  A primary care provider.  A dentist.  Your health care providers follow a schedule in order to help you get the best quality of care. The following schedule is a general guideline for your diabetes management plan. Your health care providers may also give you more specific instructions. HbA1c ( hemoglobin A1c) test This test provides information about blood sugar (glucose) control over the previous 2-3 months. It is used to check whether your diabetes management plan needs to be adjusted.  If you are meeting your treatment goals, this test is done at least 2 times a year.  If you are not meeting treatment goals or if your treatment goals have changed, this test is done 4 times a year.  Blood pressure test  This test is done at every routine medical visit. For most  people, the goal is less than 130/80. Ask your health care provider what your goal blood pressure should be. Dental and eye exams  Visit your dentist two times a year.  If you have type 1 diabetes, get an eye exam 3-5 years after you are diagnosed, and then once a year after your first exam. ? If you were diagnosed with type 1 diabetes as a child, get an eye exam when you are age 10 or older and have had diabetes for 3-5 years. After the first exam, you should get an eye exam once a year.  If you have type 2 diabetes, have an eye exam as soon as you are diagnosed, and then once a year after your first exam. Foot care exam  Visual foot exams are done at every routine medical visit. The exams check for cuts, bruises, redness, blisters, sores, or other problems with the feet.  A complete foot exam is done by your health care provider once a year. This exam includes an inspection of the structure and skin of your feet, and a check of the pulses and sensation in your feet. ? Type 1 diabetes: Get your first exam 3-5 years after diagnosis. ? Type 2 diabetes: Get your first exam as soon as you are diagnosed.  Check your feet every day for cuts, bruises, redness, blisters, or sores. If you have any of these or other problems that are not healing, contact your health care provider. Kidney function test ( urine microalbumin)  This test is done once a year. ? Type 1 diabetes:   Get your first test 5 years after diagnosis. ? Type 2 diabetes: Get your first test as soon as you are diagnosed.  If you have chronic kidney disease (CKD), get a serum creatinine and estimated glomerular filtration rate (eGFR) test once a year. Lipid profile (cholesterol, HDL, LDL, triglycerides)  This test should be done when you are diagnosed with diabetes, and every 5 years after the first test. If you are on medicines to lower your cholesterol, you may need to get this test done every year. ? The goal for LDL is less than  100 mg/dL (5.5 mmol/L). If you are at high risk, the goal is less than 70 mg/dL (3.9 mmol/L). ? The goal for HDL is 40 mg/dL (2.2 mmol/L) for men and 50 mg/dL(2.8 mmol/L) for women. An HDL cholesterol of 60 mg/dL (3.3 mmol/L) or higher gives some protection against heart disease. ? The goal for triglycerides is less than 150 mg/dL (8.3 mmol/L). Immunizations  The yearly flu (influenza) vaccine is recommended for everyone 6 months or older who has diabetes.  The pneumonia (pneumococcal) vaccine is recommended for everyone 2 years or older who has diabetes. If you are 65 or older, you may get the pneumonia vaccine as a series of two separate shots.  The hepatitis B vaccine is recommended for adults shortly after they have been diagnosed with diabetes.  The Tdap (tetanus, diphtheria, and pertussis) vaccine should be given: ? According to normal childhood vaccination schedules, for children. ? Every 10 years, for adults who have diabetes.  The shingles vaccine is recommended for people who have had chicken pox and are 50 years or older. Mental and emotional health  Screening for symptoms of eating disorders, anxiety, and depression is recommended at the time of diagnosis and afterward as needed. If your screening shows that you have symptoms (you have a positive screening result), you may need further evaluation and be referred to a mental health care provider. Diabetes self-management education  Education about how to manage your diabetes is recommended at diagnosis and ongoing as needed. Treatment plan  Your treatment plan will be reviewed at every medical visit. Summary  Managing diabetes (diabetes mellitus) can be complicated. Your diabetes treatment may be managed by a team of health care providers.  Your health care providers follow a schedule in order to help you get the best quality of care.  Standards of care including having regular physical exams, blood tests, blood pressure  monitoring, immunizations, screening tests, and education about how to manage your diabetes.  Your health care providers may also give you more specific instructions based on your individual health. This information is not intended to replace advice given to you by your health care provider. Make sure you discuss any questions you have with your health care provider. Document Released: 03/27/2009 Document Revised: 02/26/2016 Document Reviewed: 02/26/2016 Elsevier Interactive Patient Education  2018 Elsevier Inc.  

## 2017-01-26 ENCOUNTER — Telehealth: Payer: Self-pay | Admitting: Family Medicine

## 2017-01-26 NOTE — Telephone Encounter (Signed)
Tried to call patient to let her know that her appointment that she had with [facility] on 02-15-17  has been cancelled due to provider not being in the office that day line was busy three times letter was sent out to patient on 01-27-17 [facility]

## 2017-02-15 ENCOUNTER — Ambulatory Visit: Payer: No Typology Code available for payment source | Admitting: Family Medicine

## 2017-05-11 ENCOUNTER — Ambulatory Visit: Payer: No Typology Code available for payment source | Admitting: Family Medicine

## 2017-05-11 ENCOUNTER — Encounter: Payer: Self-pay | Admitting: Family Medicine

## 2017-05-11 ENCOUNTER — Other Ambulatory Visit: Payer: Self-pay

## 2017-05-11 VITALS — BP 130/84 | HR 98 | Temp 98.8°F | Resp 17 | Ht 61.0 in | Wt 239.2 lb

## 2017-05-11 DIAGNOSIS — R079 Chest pain, unspecified: Secondary | ICD-10-CM | POA: Diagnosis not present

## 2017-05-11 DIAGNOSIS — E785 Hyperlipidemia, unspecified: Secondary | ICD-10-CM

## 2017-05-11 DIAGNOSIS — E119 Type 2 diabetes mellitus without complications: Secondary | ICD-10-CM

## 2017-05-11 DIAGNOSIS — Z8249 Family history of ischemic heart disease and other diseases of the circulatory system: Secondary | ICD-10-CM | POA: Diagnosis not present

## 2017-05-11 DIAGNOSIS — Z1211 Encounter for screening for malignant neoplasm of colon: Secondary | ICD-10-CM | POA: Diagnosis not present

## 2017-05-11 DIAGNOSIS — L509 Urticaria, unspecified: Secondary | ICD-10-CM

## 2017-05-11 DIAGNOSIS — K219 Gastro-esophageal reflux disease without esophagitis: Secondary | ICD-10-CM | POA: Diagnosis not present

## 2017-05-11 LAB — POCT GLYCOSYLATED HEMOGLOBIN (HGB A1C): HEMOGLOBIN A1C: 6.6

## 2017-05-11 MED ORDER — OMEPRAZOLE 40 MG PO CPDR: 40.0000 mg | DELAYED_RELEASE_CAPSULE | Freq: Two times a day (BID) | ORAL | 11 refills | Status: AC

## 2017-05-11 NOTE — Progress Notes (Signed)
Chief Complaint  Patient presents with  . Follow-up    diabetes and reflux  . Medication Refill    lisinopril, wellbutrin xl, glucophage, and prilosec    HPI  Diabetes Mellitus: Patient presents for follow up of diabetes. Symptoms: none. Symptoms have stabilized. Patient denies foot ulcerations, hyperglycemia, hypoglycemia , increase appetite, nausea, polydipsia and polyuria.  Evaluation to date has been included: hemoglobin A1C.  Home sugars: BGs consistently in an acceptable range. Treatment to date: Continued metformin which has been effective  Lab Results  Component Value Date   HGBA1C 6.6 05/11/2017    She reports that she works a lot of hours at work She has cut down on the more than 64 ounces of caffeine she consumes and is now limiting her caffeine and drinking herbal tea. Her sister died of an aortic dissection and she is concerned about her intermittent chest pain that last about a minute at a time.  She reports that she did not go to her referral to Cardiology due her work schedule. She denies pleuritic chest pain, nausea or vomiting.  Obesity- she denies any exercise. She is compliant with metformin. Wt Readings from Last 3 Encounters:  05/11/17 239 lb 3.2 oz (108.5 kg)  01/11/17 244 lb 3.2 oz (110.8 kg)  12/09/16 251 lb 6.4 oz (114 kg)  Body mass index is 45.2 kg/m.  She is cutting back on her portions of sweets She has cut out soda She drinks more water.  Dyslipidemia: Patient presents for evaluation of lipids. She has tried lifestyle modification.  A repeat fasting lipid profile was done.  The patient does use medications that may worsen dyslipidemias (corticosteroids, progestins, anabolic steroids, diuretics, beta-blockers, amiodarone, cyclosporine, olanzapine). The patient exercises rarely.  The patient is not known to have coexisting coronary artery disease.    The 10-year ASCVD risk score Denman George(Goff DC Jr., et al., 2013) is: 3.2%   Values used to calculate the  score:     Age: [age] years     Sex: Female     Is Non-Hispanic African American: No     Diabetic: Yes     Tobacco smoker: No     Systolic Blood Pressure: 130 mmHg     Is BP treated: No     HDL Cholesterol: 63 mg/dL     Total Cholesterol: 245 mg/dL  GERD She reports that she has bad heart burn  She reports that she could not get her nexium covered by insurance and would like to switch to prilosec She reports that she does not know of anything to aggravate the reflux but she gets symptoms when she misses a dose She was buying omeprazole daily but twice a day most times   No past medical history on file.  Current Outpatient Medications  Medication Sig Dispense Refill  . aspirin EC 81 MG tablet Take 1 tablet (81 mg total) by mouth daily. 30 tablet 11  . buPROPion (WELLBUTRIN XL) 150 MG 24 hr tablet Take 1 tablet (150 mg total) by mouth daily. 90 tablet 1  . diphenhydrAMINE (BENADRYL) 25 MG tablet Take 25 mg by mouth every 6 (six) hours as needed.    Marland Kitchen. lisinopril (PRINIVIL,ZESTRIL) 5 MG tablet Take 1 tablet (5 mg total) by mouth daily. 90 tablet 1  . metFORMIN (GLUCOPHAGE) 500 MG tablet Take 1 tablet (500 mg total) by mouth daily. With your dinner since you work nights 90 tablet 1  . omeprazole (PRILOSEC) 40 MG capsule Take 1 capsule (40 mg  total) by mouth 2 (two) times daily. 60 capsule 11   No current facility-administered medications for this visit.     Allergies: No Known Allergies  No past surgical history on file.  Social History   Socioeconomic History  . Marital status: Married    Spouse name: None  . Number of children: None  . Years of education: None  . Highest education level: None  Social Needs  . Financial resource strain: None  . Food insecurity - worry: None  . Food insecurity - inability: None  . Transportation needs - medical: None  . Transportation needs - non-medical: None  Occupational History  . None  Tobacco Use  . Smoking status: Never Smoker  .  Smokeless tobacco: Never Used  Substance and Sexual Activity  . Alcohol use: No    Alcohol/week: 0.0 oz  . Drug use: No  . Sexual activity: None  Other Topics Concern  . None  Social History Narrative  . None    Family History  Problem Relation Age of Onset  . Hypertension Mother   . COPD Mother   . Hypertension Father   . Diabetes Father   . Hypertension Sister   . Heart disease Sister   . Diabetes Sister      ROS Review of Systems See HPI Constitution: No fevers or chills No malaise No diaphoresis Skin: No rash or itching Eyes: no blurry vision, no double vision GU: no dysuria or hematuria Neuro: no dizziness or headaches all others reviewed and negative   Objective: Vitals:   05/11/17 1554  BP: 130/84  Pulse: 98  Resp: 17  Temp: 98.8 F (37.1 C)  TempSrc: Oral  SpO2: 97%  Weight: 239 lb 3.2 oz (108.5 kg)  Height: 5\' 1"  (1.549 m)    Physical Exam  Constitutional: She is oriented to person, place, and time. She appears well-developed and well-nourished.  HENT:  Head: Normocephalic and atraumatic.  Eyes: Conjunctivae and EOM are normal.  Neck: Normal range of motion. No thyromegaly present.  Cardiovascular: Normal rate, regular rhythm, normal heart sounds and intact distal pulses.  Pulmonary/Chest: Effort normal and breath sounds normal. No stridor. No respiratory distress.  Abdominal: Soft. Bowel sounds are normal. She exhibits no distension. There is no tenderness. There is no guarding.  Musculoskeletal: Normal range of motion. She exhibits no edema.  Neurological: She is alert and oriented to person, place, and time.  Skin: Skin is warm. Capillary refill takes less than 2 seconds.  Psychiatric: She has a normal mood and affect. Her behavior is normal. Judgment and thought content normal.     Assessment and Plan Floree was seen today for follow-up and medication refill.  Diagnoses and all orders for this visit:  Controlled type 2 diabetes  mellitus without complication, without long-term current use of insulin (HCC)- diabetes well controlled with metformin  -     POCT glycosylated hemoglobin (Hb A1C) -     Comprehensive metabolic panel -     Lipid panel -     Ambulatory referral to Cardiology -     Ambulatory referral to diabetic education  Dyslipidemia- based on lipid results will start lipitor 20mg  -     POCT glycosylated hemoglobin (Hb A1C) -     Comprehensive metabolic panel -     Lipid panel -     Ambulatory referral to Cardiology  Family history of aortic dissection- advised pt to follow up for evaluation  -     Ambulatory referral  to Cardiology  Gastroesophageal reflux disease, esophagitis presence not specified- discussed improving her reflux control so that it does not confuse the picture of heart pain vs. GERD pain -     Ambulatory referral to Gastroenterology  Special screening for malignant neoplasms, colon- referral for colonoscopy -     Ambulatory referral to Gastroenterology  Hives of unknown origin- hives resolved but pt still requesting testing -     Ambulatory referral to Allergy  Intermittent chest pain- currently patient has sporadic chest pains, outpatient work up advised. Normal ECG 12/09/2016 -     Ambulatory referral to Cardiology  Morbid obesity [facility](HCC)- improving with metformin and dietary modifications  Referred to Nutrition  Other orders -     omeprazole (PRILOSEC) 40 MG capsule; Take 1 capsule (40 mg total) by mouth 2 (two) times daily.   A total of 40 minutes were spent face-to-face with the patient during this encounter and over half of that time was spent on counseling and coordination of care.   Remingtyn Depaola A Tamario Heal

## 2017-05-11 NOTE — Patient Instructions (Addendum)
IF you received an x-ray today, you will receive an invoice from [facility]. Please contact [facility] at [phone] with questions or concerns regarding your invoice.   IF you received labwork today, you will receive an invoice from [facility]. Please contact [facility] at [phone] with questions or concerns regarding your invoice.   Our billing staff will not be able to assist you with questions regarding bills from these companies.  You will be contacted with the lab results as soon as they are available. The fastest way to get your results is to activate your My Chart account. Instructions are located on the last page of this paperwork. If you have not heard from us regarding the results in 2 weeks, please contact this office.    We recommend that you schedule a mammogram for breast cancer screening. Typically, you do not need a referral to do this. Please contact a local imaging center to schedule your mammogram.  [facility] - [phone]) [phone]  *ask for the Radiology Department [facility]) - [phone] or [phone]) [phone]  [facility] - [phone] [facility] - [phone]) [phone] [facility] - [phone]) [phone]  *ask for the Radiology Department [facility] - [phone]) [phone]  *ask for the Radiology Department [facility] - [phone]) [phone]  *ask for the Mammography Department [facility] - [phone]) [phone] Mammogram A mammogram is an X-ray of the breasts that is done to check for abnormal changes. This procedure can screen for and detect any changes that may suggest breast cancer. A mammogram can also identify other changes and variations in the breast, such as:  Inflammation of the breast tissue (mastitis).  An infected area that contains a collection of pus (abscess).  A fluid-filled sac (cyst).  Fibrocystic changes. This is when breast tissue becomes denser,  which can make the tissue feel rope-like or uneven under the skin.  Tumors that are not cancerous (benign).  Tell a health care provider about:  Any allergies you have.  If you have breast implants.  If you have had previous breast disease, biopsy, or surgery.  If you are breastfeeding.  Any possibility that you could be pregnant, if this applies.  If you are younger than age 52.  If you have a family history of breast cancer. What are the risks? Generally, this is a safe procedure. However, problems may occur, including:  Exposure to radiation. Radiation levels are very low with this test.  The results being misinterpreted.  The need for further tests.  The inability of the mammogram to detect certain cancers.  What happens before the procedure?  Schedule your test about 1-2 weeks after your menstrual period. This is usually when your breasts are the least tender.  If you have had a mammogram done at a different facility in the past, get the mammogram X-rays or have them sent to your current exam facility in order to compare them.  Wash your breasts and under your arms the day of the test.  Do not wear deodorants, perfumes, lotions, or powders anywhere on your body on the day of the test.  Remove any jewelry from your neck.  Wear clothes that you can change into and out of easily. What happens during the procedure?  You will undress from the waist up and put on a gown.  You will stand in front of the X-ray machine.  Each breast will be placed between two plastic or  glass plates. The plates will compress your breast for a few seconds. Try to stay as relaxed as possible during the procedure. This does not cause any harm to your breasts and any discomfort you feel will be very brief.  X-rays will be taken from different angles of each breast. The procedure may vary among health care providers and hospitals. What happens after the procedure?  The mammogram will be  examined by a specialist (radiologist).  You may need to repeat certain parts of the test, depending on the quality of the images. This is commonly done if the radiologist needs a better view of the breast tissue.  Ask when your test results will be ready. Make sure you get your test results.  You may resume your normal activities. This information is not intended to replace advice given to you by your health care provider. Make sure you discuss any questions you have with your health care provider. Document Released: 05/27/2000 Document Revised: 11/02/2015 Document Reviewed: 08/08/2014 Elsevier Interactive Patient Education  2017 [employer]

## 2017-05-12 LAB — COMPREHENSIVE METABOLIC PANEL
ALBUMIN: 3.9 g/dL (ref 3.5–5.5)
ALK PHOS: 100 IU/L (ref 39–117)
ALT: 26 IU/L (ref 0–32)
AST: 18 IU/L (ref 0–40)
Albumin/Globulin Ratio: 1.3 (ref 1.2–2.2)
BUN/Creatinine Ratio: 15 (ref 9–23)
BUN: 10 mg/dL (ref 6–24)
Bilirubin Total: 0.3 mg/dL (ref 0.0–1.2)
CALCIUM: 9.3 mg/dL (ref 8.7–10.2)
CO2: 26 mmol/L (ref 20–29)
CREATININE: 0.68 mg/dL (ref 0.57–1.00)
Chloride: 98 mmol/L (ref 96–106)
GFR calc Af Amer: 116 mL/min/{1.73_m2} (ref >59)
GFR, EST NON AFRICAN AMERICAN: 101 mL/min/{1.73_m2} (ref >59)
GLOBULIN, TOTAL: 2.9 g/dL (ref 1.5–4.5)
GLUCOSE: 118 mg/dL — AB (ref 65–99)
Potassium: 4.6 mmol/L (ref 3.5–5.2)
Sodium: 138 mmol/L (ref 134–144)
Total Protein: 6.8 g/dL (ref 6.0–8.5)

## 2017-05-12 LAB — LIPID PANEL
CHOL/HDL RATIO: 3.9 ratio (ref 0.0–4.4)
CHOLESTEROL TOTAL: 245 mg/dL — AB (ref 100–199)
HDL: 63 mg/dL (ref >39)
LDL CALC: 156 mg/dL — AB (ref 0–99)
TRIGLYCERIDES: 130 mg/dL (ref 0–149)
VLDL CHOLESTEROL CAL: 26 mg/dL (ref 5–40)

## 2017-05-12 MED ORDER — ATORVASTATIN CALCIUM 20 MG PO TABS: 20.0000 mg | ORAL_TABLET | Freq: Every day | ORAL | 3 refills | Status: DC

## 2017-07-10 ENCOUNTER — Ambulatory Visit: Payer: No Typology Code available for payment source | Admitting: Allergy and Immunology

## 2017-07-25 ENCOUNTER — Encounter: Payer: Self-pay | Admitting: Family Medicine

## 2017-07-31 ENCOUNTER — Ambulatory Visit: Payer: Self-pay | Admitting: Physician Assistant

## 2017-07-31 NOTE — Progress Notes (Deleted)
Cardiology Office Note:    Date:  07/31/2017   ID:  Alexis Stark Hachey, DOB 02/05/1966, MRN [id number]  PCP:  Patient, No Pcp Per  Cardiologist:  No primary care provider on file.   Referring MD: No ref. provider found   No chief complaint on file. ***  History of Present Illness:    Alexis Stark Routzahn is a 52 y.o. female with a hx of *** who is being seen today for the evaluation of *** at the request of No ref. provider found.   Ms. Achilles DunkBahri ***  No flowsheet data found.  Prior CV studies:   The following studies were reviewed today:  ***  No past medical history on file.  No past surgical history on file.  Current Medications: No outpatient medications have been marked as taking for the 07/31/17 encounter (Appointment) with Tereso NewcomerWeaver, Scott T, PA-C.     Allergies:   Patient has no known allergies.   Social History   Socioeconomic History  . Marital status: Married    Spouse name: Not on file  . Number of children: Not on file  . Years of education: Not on file  . Highest education level: Not on file  Social Needs  . Financial resource strain: Not on file  . Food insecurity - worry: Not on file  . Food insecurity - inability: Not on file  . Transportation needs - medical: Not on file  . Transportation needs - non-medical: Not on file  Occupational History  . Not on file  Tobacco Use  . Smoking status: Never Smoker  . Smokeless tobacco: Never Used  Substance and Sexual Activity  . Alcohol use: No    Alcohol/week: 0.0 oz  . Drug use: No  . Sexual activity: Not on file  Other Topics Concern  . Not on file  Social History Narrative  . Not on file     Family Hx: The patient's family history includes COPD in her mother; Diabetes in her father and sister; Heart disease in her sister; Hypertension in her father, mother, and sister.  ROS:   Please see the history of present illness.    ROS All other systems reviewed and are negative.   EKGs/Labs/Other Test Reviewed:     EKG:  EKG is *** ordered today.  The ekg ordered today demonstrates ***  Recent Labs: 12/09/2016: TSH 1.660 05/11/2017: ALT 26; BUN 10; Creatinine, Ser 0.68; Potassium 4.6; Sodium 138   Recent Lipid Panel Lab Results  Component Value Date/Time   CHOL 245 (H) 05/11/2017 04:17 PM   TRIG 130 05/11/2017 04:17 PM   HDL 63 05/11/2017 04:17 PM   CHOLHDL 3.9 05/11/2017 04:17 PM   CHOLHDL 4.3 05/12/2016 05:43 PM   LDLCALC 156 (H) 05/11/2017 04:17 PM    Physical Exam:    VS:  There were no vitals taken for this visit.    Wt Readings from Last 3 Encounters:  05/11/17 239 lb 3.2 oz (108.5 kg)  01/11/17 244 lb 3.2 oz (110.8 kg)  12/09/16 251 lb 6.4 oz (114 kg)     ***Physical Exam  ASSESSMENT & PLAN:    No diagnosis found.***  Dispo:  No Follow-up on file.   Medication Adjustments/Labs and Tests Ordered: Current medicines are reviewed at length with the patient today.  Concerns regarding medicines are outlined above.  Orders/Tests:  No orders of the defined types were placed in this encounter.  Medication changes: No orders of the defined types were placed in this encounter.  Signed,  Tereso Newcomer, PA-C  07/31/2017 1:18 PM    [facility] [address] [place], [place], [place]  [address] Phone: [phone]; Fax: [phone]

## 2017-08-07 ENCOUNTER — Other Ambulatory Visit: Payer: Self-pay | Admitting: Family Medicine

## 2017-08-22 ENCOUNTER — Ambulatory Visit: Payer: No Typology Code available for payment source | Admitting: Allergy and Immunology

## 2017-08-25 ENCOUNTER — Encounter: Payer: Self-pay | Admitting: Physician Assistant

## 2017-08-25 ENCOUNTER — Ambulatory Visit (INDEPENDENT_AMBULATORY_CARE_PROVIDER_SITE_OTHER): Payer: No Typology Code available for payment source | Admitting: Physician Assistant

## 2017-08-25 VITALS — BP 128/72 | HR 56 | Ht 61.0 in | Wt 234.8 lb

## 2017-08-25 DIAGNOSIS — R011 Cardiac murmur, unspecified: Secondary | ICD-10-CM | POA: Diagnosis not present

## 2017-08-25 DIAGNOSIS — E785 Hyperlipidemia, unspecified: Secondary | ICD-10-CM

## 2017-08-25 DIAGNOSIS — R0789 Other chest pain: Secondary | ICD-10-CM

## 2017-08-25 DIAGNOSIS — Z8249 Family history of ischemic heart disease and other diseases of the circulatory system: Secondary | ICD-10-CM | POA: Diagnosis not present

## 2017-08-25 DIAGNOSIS — E119 Type 2 diabetes mellitus without complications: Secondary | ICD-10-CM

## 2017-08-25 LAB — BASIC METABOLIC PANEL
BUN/Creatinine Ratio: 17 (ref 9–23)
BUN: 10 mg/dL (ref 6–24)
CALCIUM: 9.3 mg/dL (ref 8.7–10.2)
CO2: 25 mmol/L (ref 20–29)
CREATININE: 0.59 mg/dL (ref 0.57–1.00)
Chloride: 99 mmol/L (ref 96–106)
GFR calc Af Amer: 121 mL/min/{1.73_m2} (ref >59)
GFR, EST NON AFRICAN AMERICAN: 105 mL/min/{1.73_m2} (ref >59)
GLUCOSE: 129 mg/dL — AB (ref 65–99)
POTASSIUM: 4.4 mmol/L (ref 3.5–5.2)
SODIUM: 136 mmol/L (ref 134–144)

## 2017-08-25 MED ORDER — METOPROLOL TARTRATE 50 MG PO TABS: 50.0000 mg | ORAL_TABLET | Freq: Once | ORAL | 0 refills | Status: DC

## 2017-08-25 NOTE — Patient Instructions (Addendum)
Medication Instructions:  1. Your physician recommends that you continue on your current medications as directed. Please refer to the Current Medication list given to you today.   Labwork: TODAY BMET  Testing/Procedures: 1. PLEASE SCHEDULE A CORONARY CT[facility] TO [facility]  2. Your physician has requested that you have an echocardiogram. Echocardiography is a painless test that uses sound waves to create images of your heart. It provides your doctor with information about the size and shape of your heart and how well your heart's chambers and valves are working. This procedure takes approximately one hour. There are no restrictions for this procedure.    Follow-Up: [facility], [facility] IN [facility] MONTH  Any Other Special Instructions Will Be Listed Below (If Applicable). If you need a refill on your cardiac medications before your next appointment, please call your pharmacy.  Please arrive at the [facility] main entrance of [facility] at xx:xx AM (30-45 minutes prior to test start time)  [facility] [address] [place], [place] [address] [phone]  Proceed to the [facility] Radiology Department (First Floor).  Please follow these instructions carefully (unless otherwise directed):  Hold all erectile dysfunction medications at least 48 hours prior to test.  On the Night Before the Test: . Drink plenty of water. . DO NOT consume any caffeinated/decaffeinated beverages or chocolate 12 hours prior to your test. . DO NOT TAKE any antihistamines 12 hours prior to your test. . If you take Metformin DO NOT TAKE 24 hours prior to test.   On the Day of the Test: . Drink plenty of water. DO NOT drink any water within one hour of the test. . DO NOT eat any food 4 hours prior to the test. . You may take your regular medications prior to the test. . IF NOT ON A BETA BLOCKER - Take 50 mg of lopressor (metoprolol) one hour before the test. A  prescription has been sent to your Pharmacy  After the Test: . Drink plenty of water. . After receiving IV contrast, you may experience a mild flushed feeling. This is normal. . On occasion, you may experience a mild rash up to 24 hours after the test. This is not dangerous. If this occurs, you can take Benadryl 25 mg and increase your fluid intake. . If you experience trouble breathing, this can be serious. If it is severe call 911 IMMEDIATELY. If it is mild, please call our office. . If you take any of these medications: Glipizide/Metformin, Avandament, Glucavance, please do not take 48 hours after completing test.

## 2017-08-25 NOTE — Progress Notes (Addendum)
Cardiology Office Note:    Date:  08/25/2017   ID:  Alexis Stark, DOB 13-Aug-1965, MRN [id number]  PCP:  Patient, No Pcp Per  Cardiologist:  Alexis Miss, MD / Alexis Newcomer, PA-C   Referring MD: Alexis Bosworth, MD   Chief Complaint  Patient presents with  . Chest Pain    History of Present Illness:    Alexis Stark is a 52 y.o. female with type 2 diabetes, hyperlipidemia, morbid obesity, GERD who is being seen today for the evaluation of chest pain and family history of aortic dissection at the request of Alexis Bosworth, MD.   Ms. Alexis Stark has no history of coronary artery disease, heart failure or stroke.  She is currently on atorvastatin for hyperlipidemia.  She takes lisinopril for renal protection in the setting of diabetes.  She has a history of chest discomfort for the past 10 years.  This typically occurs at rest and sometimes awakens her from sleep.  It does not seem to be associated with meals.  She denies pleuritic symptoms or chest pain with lying supine.  She denies exertional symptoms.  She denies shortness of breath associated with her chest pain or nausea or diaphoresis.  She denies history of syncope.  She denies orthopnea, PND or edema.  Her chest discomfort is substernal and radiates through to her back.  It only lasts about 3 minutes.  She denies symptoms consistent with claudication.  Of note, her sister died in her late 60s from thoracic aortic dissection.  She was found to have an aneurysm on autopsy.  She also has another sister who died in her mid 73s.  She had what sounds like anginal symptoms for a week prior to her death.  She tells me that she passed away in her sleep.  No autopsy was performed.  PAD Screen 08/25/2017  Previous PAD dx? No  Previous surgical procedure? No  Pain with walking? Yes  Subsides with rest? Yes  Feet/toe relief with dangling? No  Painful, non-healing ulcers? No  Extremities discolored? No    Prior CV studies:   The following studies  were reviewed today:  None   Past Medical History:  Diagnosis Date  . Dyslipidemia 12/09/2016  . Family history of aortic dissection 12/09/2016  . Gastroesophageal reflux disease 12/09/2016  . Morbid obesity (HCC) 12/09/2016  . Type 2 diabetes mellitus without complication (HCC) 12/09/2016    History reviewed. No pertinent surgical history.  Current Medications: Current Meds  Medication Sig  . aspirin EC 81 MG tablet Take 1 tablet (81 mg total) by mouth daily.  Marland Kitchen atorvastatin (LIPITOR) 20 MG tablet Take 1 tablet (20 mg total) by mouth daily.  . diphenhydrAMINE (BENADRYL) 25 MG tablet Take 25 mg by mouth every 6 (six) hours as needed.  Marland Kitchen lisinopril (PRINIVIL,ZESTRIL) 5 MG tablet TAKE 1 TABLET BY MOUTH ONCE DAILY  . metFORMIN (GLUCOPHAGE) 500 MG tablet TAKE 1 TABLET BY MOUTH ONCE DAILY. WITH DINNER SINCE YOU WORK NIGHTS.  Marland Kitchen omeprazole (PRILOSEC) 40 MG capsule Take 1 capsule (40 mg total) by mouth 2 (two) times daily.     Allergies:   Patient has no known allergies.   Social History   Socioeconomic History  . Marital status: Married    Spouse name: None  . Number of children: 2  . Years of education: None  . Highest education level: None  Social Needs  . Financial resource strain: None  . Food insecurity - worry: None  . Food insecurity -  inability: None  . Transportation needs - medical: None  . Transportation needs - non-medical: None  Occupational History  . Occupation: Nurse    Employer: [employer]  Tobacco Use  . Smoking status: Never Smoker  . Smokeless tobacco: Never Used  Substance and Sexual Activity  . Alcohol use: No    Alcohol/week: 0.0 oz  . Drug use: No  . Sexual activity: None  Other Topics Concern  . None  Social History Narrative   Alexis Stark - in [place] since late 1990s.     Family Hx: The patient's family history includes Aortic dissection (age of onset: 3869) in her sister; COPD in her mother; Diabetes in her father and sister; Heart disease in her  sister; Heart disease (age of onset: 1756) in her sister; Hypertension in her father, mother, and sister; Stroke in her father.  ROS:   Please see the history of present illness.    ROS All other systems reviewed and are negative.   EKGs/Labs/Other Test Reviewed:    EKG:  EKG is  ordered today.  The ekg ordered today demonstrates sinus bradycardia, HR 56, leftward axis, QTC 441  Recent Labs: 12/09/2016: TSH 1.660 05/11/2017: ALT 26; BUN 10; Creatinine, Ser 0.68; Potassium 4.6; Sodium 138   Recent Lipid Panel Lab Results  Component Value Date/Time   CHOL 245 (H) 05/11/2017 04:17 PM   TRIG 130 05/11/2017 04:17 PM   HDL 63 05/11/2017 04:17 PM   CHOLHDL 3.9 05/11/2017 04:17 PM   CHOLHDL 4.3 05/12/2016 05:43 PM   LDLCALC 156 (H) 05/11/2017 04:17 PM    Physical Exam:    VS:  BP 128/72 (BP Location: Left Arm, Patient Position: Sitting, Cuff Size: Large)   Pulse (!) 56   Ht 5\' 1"  (1.549 m)   Wt 234 lb 12.8 oz (106.5 kg)   SpO2 95%   BMI 44.37 kg/m     Wt Readings from Last 3 Encounters:  08/25/17 234 lb 12.8 oz (106.5 kg)  05/11/17 239 lb 3.2 oz (108.5 kg)  01/11/17 244 lb 3.2 oz (110.8 kg)     Physical Exam  Constitutional: She is oriented to person, place, and time. She appears well-developed and well-nourished. No distress.  HENT:  Head: Normocephalic and atraumatic.  Neck: No JVD present. Carotid bruit is not present.  Cardiovascular: Normal rate and regular rhythm.  Murmur heard.  Low-pitched early systolic murmur is present with a grade of 2/6. DP/PT 2+ bilat  Pulmonary/Chest: Effort normal. She has no rales.  Abdominal: Soft.  Musculoskeletal: She exhibits no edema.  Neurological: She is alert and oriented to person, place, and time.  Skin: Skin is warm and dry.    ASSESSMENT & PLAN:    #1.  Chest pain, midsternal  Chest pain is atypical for ischemia.  She has what sounds like a family history of coronary artery disease.  She also has a risk equivalent with  diabetes.  Due to her obesity, nuclear stress testing will likely not be helpful.  Given her family history of thoracic aortic dissection, she needs a CT scan to screen for thoracic aortic aneurysm.  I reviewed her case today with Dr. Elease HashimotoNahser.  We felt that her best option would be to proceed with coronary CTA to better define her coronary arteries as well as to look at her thoracic aorta.  -Arrange coronary CTA  #2.  Family history of aortic dissection Arrange coronary CTA to further evaluate for thoracic aort aneurysm.ic   #3.  Type  2 diabetes mellitus without complication, without long-term current use of insulin (HCC) Continue follow-up with primary care.  #4.  Dyslipidemia Continue statin therapy, which is managed by primary care.  #5.  Murmur She has a murmur on exam.  Obtain echocardiogram to rule out structural heart disease.   Dispo:  Return in about 1 month (around 09/25/2017) for Follow up after testing, w/ Alexis Newcomer, PA-C.   Medication Adjustments/Labs and Tests Ordered: Current medicines are reviewed at length with the patient today.  Concerns regarding medicines are outlined above.  Orders/Tests:  Orders Placed This Encounter  Procedures  . CT CORONARY MORPH W/CTA COR W/SCORE W/CA W/CM &/OR WO/CM  . Basic Metabolic Panel (BMET)  . EKG 12-Lead  . ECHOCARDIOGRAM COMPLETE   Medication changes: No orders of the defined types were placed in this encounter.  Signed, Alexis Newcomer, PA-C  08/25/2017 2:07 PM    [facility] [address] [place], [place], [place]  [address] Phone: [phone]; Fax: [phone]   Attending Note:   The patient was seen and examined.  Agree with assessment and plan as noted above.  Changes made to the above note as needed.  Patient seen and independently examined with  Alexis Newcomer, PA .   We discussed all aspects of the encounter. I agree with the assessment and plan as stated above.  1.  Atypical chest pain: Symptoms  are fairly atypical.  She does have a family history of coronary artery disease.  We will schedule her for a coronary CT angiogram. This will also allow [place] to look at her aorta.  She has a family history of aortic dissection. We will get an echocardiogram for further assessment of her left ventricular function and valvular function.   I have spent a total of 40 minutes with patient reviewing hospital  notes , telemetry, EKGs, labs and examining patient as well as establishing an assessment and plan that was discussed with the patient. > 50% of time was spent in direct patient care.    Vesta Mixer, Montez Hageman., MD, [facility] 08/28/2017, 11:59 AM [address]  [address] Office [phone] Pager [phone]

## 2017-08-25 NOTE — Addendum Note (Signed)
Addended by: Tarri FullerFIATO, CAROL M on: 08/25/2017 02:38 PM   Modules accepted: Orders

## 2017-08-25 NOTE — Addendum Note (Signed)
Addended by: Tarri FullerFIATO, Tywanda Rice M on: 08/25/2017 02:29 PM   Modules accepted: Orders

## 2017-08-28 ENCOUNTER — Telehealth: Payer: Self-pay | Admitting: *Deleted

## 2017-08-28 NOTE — Telephone Encounter (Signed)
-----   Message from Scott T Weaver, PA-C sent at 08/25/2017  4:32 PM EDT ----- The kidney function (Creatinine) and potassium are normal. Continue current medications and follow up as planned.  Scott Weaver, PA-C    08/25/2017 4:32 PM 

## 2017-08-28 NOTE — Telephone Encounter (Signed)
Left message x 2 to go over lab results as well as go over her Coronary CT instructions.

## 2017-08-29 ENCOUNTER — Encounter: Payer: Self-pay | Admitting: *Deleted

## 2017-08-29 NOTE — Telephone Encounter (Signed)
-----   Message from Beatrice LecherScott T Weaver, [place]C sent at 08/25/2017  4:32 PM EDT ----- The kidney function (Creatinine) and potassium are normal. Continue current medications and follow up as planned.  Tereso NewcomerScott Weaver, PA-C    08/25/2017 4:32 PM

## 2017-08-29 NOTE — Telephone Encounter (Signed)
Left message x 3 to go over lab results and to go over Coronary CT Instructions. I will mail a letter to the pt today.

## 2017-09-05 ENCOUNTER — Ambulatory Visit (HOSPITAL_COMMUNITY): Payer: No Typology Code available for payment source

## 2017-09-19 ENCOUNTER — Telehealth: Payer: Self-pay | Admitting: *Deleted

## 2017-09-19 NOTE — Telephone Encounter (Signed)
-----   Message from Carmelina PaddockAshley N Wertz sent at 09/19/2017  9:20 AM EDT ----- Regarding: Coronary CTA Insurance was waiting on echo results to try to get approval for coronary cta.  I noticed the patient has cancelled her echo and has not rescheduled. Do you know if she plans on having this?  I don't think they will approve her coronary CTA without it.  Thank you!  Morrie SheldonAshley

## 2017-09-19 NOTE — Telephone Encounter (Signed)
I lmptcb that we need to reschedule Echo in order for Coronary CT w/FFR can be pre-certed. WE have been trying to get pt scheduled for the CT which was ordered when she saw Tereso NewcomerScott Weaver, PAC on 08/25/17. At that time an echo was also scheduled which pt has cancelled. In regards to the Coronary CT w/FFR per Delrae AlfredAshley Wertz in billing the insurance company is waiting on echo results before approving the CT.

## 2017-09-20 ENCOUNTER — Encounter: Payer: Self-pay | Admitting: *Deleted

## 2017-09-25 ENCOUNTER — Other Ambulatory Visit (HOSPITAL_COMMUNITY): Payer: No Typology Code available for payment source

## 2017-09-25 DIAGNOSIS — R0989 Other specified symptoms and signs involving the circulatory and respiratory systems: Secondary | ICD-10-CM

## 2017-09-26 ENCOUNTER — Telehealth: Payer: Self-pay | Admitting: *Deleted

## 2017-09-26 ENCOUNTER — Ambulatory Visit: Payer: No Typology Code available for payment source | Admitting: Physician Assistant

## 2017-09-26 NOTE — Progress Notes (Deleted)
Cardiology Office Note:    Date:  09/26/2017   ID:  Alexis Stark, DOB 11/19/1965, MRN [id number]  PCP:  Patient, No Pcp Per  Cardiologist:  Kristeen MissPhilip Nahser, MD   Referring MD: No ref. provider found   No chief complaint on file. ***  History of Present Illness:    Alexis Stark is a 52 y.o. female with type 2 diabetes, hyperlipidemia, morbid obesity, GERD  Ms. Swor ***  Prior CV studies:   The following studies were reviewed today:  ***  Past Medical History:  Diagnosis Date  . Dyslipidemia 12/09/2016  . Family history of aortic dissection 12/09/2016  . Gastroesophageal reflux disease 12/09/2016  . Morbid obesity (HCC) 12/09/2016  . Type 2 diabetes mellitus without complication (HCC) 12/09/2016   Surgical Hx: The patient  has no past surgical history on file.   Current Medications: No outpatient medications have been marked as taking for the 09/26/17 encounter (Appointment) with Tereso NewcomerWeaver, Meghin Thivierge T, PA-C.     Allergies:   Patient has no known allergies.   Social History   Tobacco Use  . Smoking status: Never Smoker  . Smokeless tobacco: Never Used  Substance Use Topics  . Alcohol use: No    Alcohol/week: 0.0 oz  . Drug use: No     Family Hx: The patient's family history includes Aortic dissection (age of onset: 9069) in her sister; COPD in her mother; Diabetes in her father and sister; Heart disease in her sister; Heart disease (age of onset: 7456) in her sister; Hypertension in her father, mother, and sister; Stroke in her father.  ROS:   Please see the history of present illness.    ROS All other systems reviewed and are negative.   EKGs/Labs/Other Test Reviewed:    EKG:  EKG is *** ordered today.  The ekg ordered today demonstrates ***  Recent Labs: 12/09/2016: TSH 1.660 05/11/2017: ALT 26 08/25/2017: BUN 10; Creatinine, Ser 0.59; Potassium 4.4; Sodium 136   Recent Lipid Panel Lab Results  Component Value Date/Time   CHOL 245 (H) 05/11/2017 04:17 PM   TRIG 130  05/11/2017 04:17 PM   HDL 63 05/11/2017 04:17 PM   CHOLHDL 3.9 05/11/2017 04:17 PM   CHOLHDL 4.3 05/12/2016 05:43 PM   LDLCALC 156 (H) 05/11/2017 04:17 PM    Physical Exam:    VS:  There were no vitals taken for this visit.    Wt Readings from Last 3 Encounters:  08/25/17 234 lb 12.8 oz (106.5 kg)  05/11/17 239 lb 3.2 oz (108.5 kg)  01/11/17 244 lb 3.2 oz (110.8 kg)     ***Physical Exam  ASSESSMENT & PLAN:    No diagnosis found.*** #1.  Chest pain, midsternal  Chest pain is atypical for ischemia.  She has what sounds like a family history of coronary artery disease.  She also has a risk equivalent with diabetes.  Due to her obesity, nuclear stress testing will likely not be helpful.  Given her family history of thoracic aortic dissection, she needs a CT scan to screen for thoracic aortic aneurysm.  I reviewed her case today with Dr. Elease HashimotoNahser.  We felt that her best option would be to proceed with coronary CTA to better define her coronary arteries as well as to look at her thoracic aorta.             -Arrange coronary CTA  #2.  Family history of aortic dissection Arrange coronary CTA to further evaluate for thoracic aort aneurysm.ic   #3.  Type 2 diabetes mellitus without complication, without long-term current use of insulin (HCC) Continue follow-up with primary care.  #4.  Dyslipidemia Continue statin therapy, which is managed by primary care.  #5.  Murmur She has a murmur on exam.  Obtain echocardiogram to rule out structural heart disease.  Dispo:  No follow-ups on file.   Medication Adjustments/Labs and Tests Ordered: Current medicines are reviewed at length with the patient today.  Concerns regarding medicines are outlined above.  Tests Ordered: No orders of the defined types were placed in this encounter.  Medication Changes: No orders of the defined types were placed in this encounter.   Signed, Tereso Newcomer, PA-C  09/26/2017 8:26 AM    [facility] [address] [place], [place], [place]  [address] Phone: [phone]; Fax: [phone]

## 2017-09-26 NOTE — Telephone Encounter (Signed)
(  PER SCOTT W. PAC TESTING HAS NOT YET BEEN DONE. PT HAS CANCELLED AND NO SHOWED FOR HER ECHO. LMOM PER PA APPT TODAY CANCELLED AS TO NEED TESTING TO BE COMPLETED BEFORE HER F/U.Marland Kitchen.Marland Kitchen.CMF)  LMPTCB to reschedule her echo. Per her insurance echo is to be done before they will approve CT.

## 2017-10-16 ENCOUNTER — Telehealth: Payer: Self-pay | Admitting: Physician Assistant

## 2017-10-16 NOTE — Telephone Encounter (Signed)
Follow up    Alexis Stark we were just checking on the Status for the Cardiac CT, have you heard from the patient about rescheduling the Echo, or would you like [place] to reach out to patient to see if we can contact them to get it scheduled?

## 2017-10-16 NOTE — Telephone Encounter (Signed)
[facility] area sent message asking if they should try to reach the pt again to reschedule echo. I will send a message to [facility] if they can reach the pt to reschedule that will be fine.

## 2017-11-02 ENCOUNTER — Encounter: Payer: Self-pay | Admitting: Physician Assistant

## 2018-04-11 ENCOUNTER — Telehealth: Payer: Self-pay | Admitting: Physician Assistant

## 2018-04-11 NOTE — Telephone Encounter (Signed)
New message      [facility] Pre-operative Risk Assessment    Request for surgical clearance:  1. What type of surgery is being performed? Tooth extraction #17, implant placement  2. When is this surgery scheduled? 04/16/2018  3. What type of clearance is required (medical clearance vs. Pharmacy clearance to hold med vs. Both)? both  4. Are there any medications that need to be held prior to surgery and how long?will wait on Dr. To advise   5. Practice name and name of physician performing surgery? [facility], Dr. Helene Kelp [place]  6. What is your office phone number [phone]   7.   What is your office fax number [phone]  8.   Anesthesia type (None, local, MAC, general) ? local   Alexis Stark 04/11/2018, 4:41 PM  _________________________________________________________________   (provider comments below)

## 2018-04-12 NOTE — Telephone Encounter (Signed)
Call pt number on file and couldn't get in get in touch with pt. A message stated that the phone number has calling restrictions.

## 2018-04-12 NOTE — Telephone Encounter (Signed)
   Primary Cardiologist:Philip Nahser, MD  Chart reviewed as part of pre-operative protocol coverage. Because of Alexis Stark's past medical history and time since last visit, he/she will require a follow-up visit in order to better assess preoperative cardiovascular risk.  Last seen 08/25/17 for chest pain. Order echo and coronary CTA but never done.   Pre-op covering staff: - Please schedule appointment and call patient to inform them. - Please contact requesting surgeon's office via preferred method (i.e, phone, fax) to inform them of need for appointment prior to surgery.   [place], [place]  04/12/2018, 12:49 PM

## 2018-04-17 NOTE — Telephone Encounter (Signed)
Attempted to reach pt, Number on file states that the number has calling restrictions so I could not leave a voicemail

## 2018-04-18 NOTE — Telephone Encounter (Signed)
Left a detailed message for pt to call the office back UE:[id number] clearance, that she will need an appt before this can be done.

## 2018-04-19 NOTE — Telephone Encounter (Signed)
Left another detailed message re: pt needing an appt before surgery clearance can be given, and asked her to call and make that apppt.  Have called [facility] and have made Christina aware that pt we have made several attempts to reach pt with no success.  She did thank me for keeping them updated.

## 2018-04-23 NOTE — Telephone Encounter (Signed)
3rd attempt to reach pt.  Today when both #'s were called, both were out of service. Will send this communication back to the surgeion's office to make them aware and delete this out of the preop call back pool.

## 2018-05-23 ENCOUNTER — Ambulatory Visit: Payer: No Typology Code available for payment source | Admitting: Physician Assistant

## 2018-05-23 ENCOUNTER — Encounter: Payer: Self-pay | Admitting: Physician Assistant

## 2018-05-23 VITALS — BP 160/80 | HR 78 | Ht 62.0 in | Wt 234.4 lb

## 2018-05-23 DIAGNOSIS — Z8249 Family history of ischemic heart disease and other diseases of the circulatory system: Secondary | ICD-10-CM

## 2018-05-23 DIAGNOSIS — R0789 Other chest pain: Secondary | ICD-10-CM

## 2018-05-23 DIAGNOSIS — R011 Cardiac murmur, unspecified: Secondary | ICD-10-CM | POA: Diagnosis not present

## 2018-05-23 DIAGNOSIS — I10 Essential (primary) hypertension: Secondary | ICD-10-CM

## 2018-05-23 MED ORDER — METOPROLOL SUCCINATE ER 50 MG PO TB24: 50.0000 mg | ORAL_TABLET | Freq: Every day | ORAL | 3 refills | Status: AC

## 2018-05-23 NOTE — Patient Instructions (Addendum)
Medication Instructions:  Your physician has recommended you make the following change in your medication:  1. START TOPROL XL 50 MG DAILY.   If you need a refill on your cardiac medications before your next appointment, please call your pharmacy.   Lab work: NONE If you have labs (blood work) drawn today and your tests are completely normal, you will receive your results only by: Marland Kitchen. MyChart Message (if you have MyChart) OR . A paper copy in the mail If you have any lab test that is abnormal or we need to change your treatment, we will call you to review the results.  Testing/Procedures: Your physician has requested that you have an echocardiogram. Echocardiography is a painless test that uses sound waves to create images of your heart. It provides your doctor with information about the size and shape of your heart and how well your heart's chambers and valves are working. This procedure takes approximately one hour. There are no restrictions for this procedure.  PLEASE SCHEDULE A CORONARY CT-[facility] TO BE D[facility] AT [facility]. THE CTA NEEDS TO BE RESCHEDULE.   Follow-Up: At [facility], you and your health needs are our priority.  As part of our continuing mission to provide you with exceptional heart care, we have created designated Provider Care Teams.  These Care Teams include your primary Cardiologist (physician) and Advanced Practice Providers (APPs -  Physician Assistants and Nurse Practitioners) who all work together to provide you with the care you need, when you need it. . Your physician recommends that you schedule a follow-up appointment in: 2 WEEKS WITH SCOTT WEAVER, PA-C  PLEASE FOLLOW UP WITH YOUR PRIMARY CARE DOCTOR   Any Other Special Instructions Will Be Listed Below (If Applicable).  Please arrive at the [facility] main entrance of [facility] at xx:xx AM (30-45 minutes prior to test start time)  [facility]  [address] [place], [place]  [id number] [phone]  Proceed to the [facility] Radiology Department (First Floor).  Please follow these instructions carefully (unless otherwise directed):  On the Night Before the Test: . Be sure to Drink plenty of water. . Do not consume any caffeinated/decaffeinated beverages or chocolate 12 hours prior to your test. . Do not take any antihistamines 12 hours prior to your test. . If you take Metformin do not take 24 hours prior to test.  On the Day of the Test: . Drink plenty of water. Do not drink any water within one hour of the test. . Do not eat any food 4 hours prior to the test. . You may take your regular medications prior to the test.  . Take metoprolol (Lopressor) two hours prior to test. . HOLD Furosemide/Hydrochlorothiazide morning of the test      After the Test: . Drink plenty of water. . After receiving IV contrast, you may experience a mild flushed feeling. This is normal. . On occasion, you may experience a mild rash up to 24 hours after the test. This is not dangerous. If this occurs, you can take Benadryl 25 mg and increase your fluid intake. . If you experience trouble breathing, this can be serious. If it is severe call 911 IMMEDIATELY. If it is mild, please call our office. . If you take any of these medications: Glipizide/Metformin, Avandament, Glucavance, please do not take 48 hours after completing test.

## 2018-05-23 NOTE — Progress Notes (Signed)
Cardiology Office Note:    Date:  05/23/2018   ID:  [name], DOB 09/27/1965, MRN [id number]  PCP:  Doristine Bosworth, MD  Cardiologist:  Kristeen Miss, MD   Electrophysiologist:  None   Referring MD: No ref. provider found   Chief Complaint  Patient presents with  . Surgical Clearance - needs dental work     History of Present Illness:    Jalon Blackwelder is a 52 y.o. female with type 2 diabetes, hyperlipidemia, morbid obesity, GERD, family history of aortic dissection.  She was initially evaluated in March 2019 for chest pain.  An echocardiogram and coronary CTA were both ordered.  However, the patient never had these done.    Ms. Lightcap returns for follow-up.  She needs some dental work soon.  Her dentist requested clearance from our office.  She went home to [place] several months ago.  Her A1c was in the 5 range.  She decided to stop metformin.  She also had some low blood pressures and decided to stop lisinopril.  She also stopped her Lipitor.  She continues to have occasional episodes of substernal chest discomfort with radiation to her back.  There has been no change in her pattern.  She denies significant shortness of breath.  She denies syncope, paroxysmal nocturnal dyspnea.  She has some dependent pedal edema without significant change.  Prior CV studies:   The following studies were reviewed today:  None   Past Medical History:  Diagnosis Date  . Dyslipidemia 12/09/2016  . Family history of aortic dissection 12/09/2016  . Gastroesophageal reflux disease 12/09/2016  . Morbid obesity (HCC) 12/09/2016  . Type 2 diabetes mellitus without complication (HCC) 12/09/2016   Surgical Hx: The patient  has no past surgical history on file.   Current Medications: Current Meds  Medication Sig  . omeprazole (PRILOSEC) 40 MG capsule Take 1 capsule (40 mg total) by mouth 2 (two) times daily.  . [DISCONTINUED] metoprolol tartrate (LOPRESSOR) 50 MG tablet Take 1 tablet (50 mg total) by  mouth once for 1 dose. You will take 1 hour before your CT     Allergies:   Patient has no known allergies.   Social History   Tobacco Use  . Smoking status: Never Smoker  . Smokeless tobacco: Never Used  Substance Use Topics  . Alcohol use: No    Alcohol/week: 0.0 standard drinks  . Drug use: No     Family Hx: The patient's family history includes Aortic dissection (age of onset: 78) in her sister; COPD in her mother; Diabetes in her father and sister; Heart disease in her sister; Heart disease (age of onset: 84) in her sister; Hypertension in her father, mother, and sister; Stroke in her father.  ROS:   Please see the history of present illness.    ROS All other systems reviewed and are negative.   EKGs/Labs/Other Test Reviewed:    EKG:  EKG is  ordered today.  The ekg ordered today demonstrates normal sinus rhythm, heart rate 78, poor R wave progression, QTC 490, similar to prior tracing  Recent Labs: 08/25/2017: BUN 10; Creatinine, Ser 0.59; Potassium 4.4; Sodium 136   Recent Lipid Panel Lab Results  Component Value Date/Time   CHOL 245 (H) 05/11/2017 04:17 PM   TRIG 130 05/11/2017 04:17 PM   HDL 63 05/11/2017 04:17 PM   CHOLHDL 3.9 05/11/2017 04:17 PM   CHOLHDL 4.3 05/12/2016 05:43 PM   LDLCALC 156 (H) 05/11/2017 04:17 PM  Physical Exam:    VS:  BP (!) 160/80   Pulse 78   Ht 5\' 2"  (1.575 m)   Wt 234 lb 6.4 oz (106.3 kg)   SpO2 93%   BMI 42.87 kg/m     Wt Readings from Last 3 Encounters:  05/23/18 234 lb 6.4 oz (106.3 kg)  08/25/17 234 lb 12.8 oz (106.5 kg)  05/11/17 239 lb 3.2 oz (108.5 kg)     Physical Exam  Constitutional: She is oriented to person, place, and time. She appears well-developed and well-nourished. No distress.  HENT:  Head: Normocephalic and atraumatic.  Eyes: No scleral icterus.  Neck: No JVD present. No thyromegaly present.  Cardiovascular: Normal rate and regular rhythm.  Murmur heard.  Systolic murmur is present with a grade  of 2/6 at the upper right sternal border. Pulmonary/Chest: Effort normal. She has no rales.  Abdominal: Soft. She exhibits no distension.  Musculoskeletal: She exhibits no edema.  Lymphadenopathy:    She has no cervical adenopathy.  Neurological: She is alert and oriented to person, place, and time.  Skin: Skin is warm and dry.  Psychiatric: She has a normal mood and affect.    ASSESSMENT & PLAN:    Essential hypertension The patient's blood pressure today is markedly uncontrolled.  Repeat by me is 160/100 on the right and 160/96 on the left.  She had some hypotensive episodes in the past on lisinopril and decided to stop this on her own.  I am going to start her on metoprolol succinate 50 mg daily.  I have asked her to monitor her blood pressure 1-2 times a day and bring those readings in with her at follow-up in 2 weeks.  Chest pain, midsternal She continues to have fairly atypical chest pain.  When she was initially seen in March 2019, we decided to proceed with coronary CTA to rule out ischemic heart disease as well as to assess for thoracic aortic aneurysm.  This test was never performed.  She is ready to schedule it now.  -Arrange coronary CTA  Family history of aortic dissection She has a sister who had a thoracic aortic dissection.  She does not know of any other family members who had thoracic aortic aneurysms or dissections.  If she has enlargement of her thoracic aorta, it may be beneficial to refer her to our geneticist for further testing.  Proceed with coronary CTA as outlined.  Murmur We planned on obtaining an echocardiogram when she was seen in March 2019.  This test was never performed.  She is ready to schedule now.  Arrange 2D echocardiogram.  Surgical clearance Generally, no further testing is needed for dental extractions which are low risk procedures.  However, given her uncontrolled blood pressure, I would hold off on proceeding until we see her back to confirm that  her blood pressure is better controlled on medical therapy.   Dispo:  Return in about 2 weeks (around 06/06/2018) for Close Follow Up, w/ Tereso NewcomerScott Duaa Stelzner, PA-C.   Medication Adjustments/Labs and Tests Ordered: Current medicines are reviewed at length with the patient today.  Concerns regarding medicines are outlined above.  Tests Ordered: Orders Placed This Encounter  Procedures  . CT CORONARY MORPH W/CTA COR W/SCORE W/CA W/CM &/OR WO/CM  . CT CORONARY FRACTIONAL FLOW RESERVE DATA PREP  . CT CORONARY FRACTIONAL FLOW RESERVE FLUID ANALYSIS  . EKG 12-Lead  . ECHOCARDIOGRAM COMPLETE   Medication Changes: Meds ordered this encounter  Medications  . metoprolol succinate (TOPROL-XL)  50 MG 24 hr tablet    Sig: Take 1 tablet (50 mg total) by mouth daily. Take with or immediately following a meal.    Dispense:  90 tablet    Refill:  3    Signed, Tereso Newcomer, PA-C  05/23/2018 12:09 PM    [facility] [address] [place], [place], [place]  [address] Phone: [phone]; Fax: [phone]

## 2018-05-24 ENCOUNTER — Telehealth: Payer: Self-pay | Admitting: Family Medicine

## 2018-05-24 ENCOUNTER — Telehealth: Payer: Self-pay | Admitting: Physician Assistant

## 2018-05-24 NOTE — Telephone Encounter (Signed)
LVM for pt in regards to her call needing to make an appt with Dr. Creta LevinStallings per her cardiologist. I have an appt on 05/28/18 that I am holding open. If pt calls back, please call [place] and ask for Tanya. Thank you!

## 2018-05-24 NOTE — Telephone Encounter (Signed)
Called the pharmacy and they had the metoprolol prescription on hold, they are going to refill it. Spoke with the patient and advised the order is going to be filled. She had no further questions.

## 2018-05-24 NOTE — Telephone Encounter (Signed)
New Message:      Pt said when she saw Tereso NewcomerScott Weaver yesterday, he said he was going to put her on a new blood pressure medicine. When she went to the pharmacy, they only gave her one pill.

## 2018-05-29 ENCOUNTER — Ambulatory Visit (HOSPITAL_COMMUNITY): Payer: No Typology Code available for payment source | Attending: Cardiovascular Disease

## 2018-05-29 ENCOUNTER — Other Ambulatory Visit: Payer: Self-pay

## 2018-05-29 ENCOUNTER — Encounter: Payer: Self-pay | Admitting: Physician Assistant

## 2018-05-29 DIAGNOSIS — I10 Essential (primary) hypertension: Secondary | ICD-10-CM | POA: Diagnosis present

## 2018-05-29 DIAGNOSIS — R0789 Other chest pain: Secondary | ICD-10-CM | POA: Diagnosis present

## 2018-05-29 DIAGNOSIS — R011 Cardiac murmur, unspecified: Secondary | ICD-10-CM | POA: Diagnosis not present

## 2018-05-29 DIAGNOSIS — Z8249 Family history of ischemic heart disease and other diseases of the circulatory system: Secondary | ICD-10-CM | POA: Diagnosis not present

## 2018-06-08 ENCOUNTER — Encounter: Payer: Self-pay | Admitting: Physician Assistant

## 2018-06-08 ENCOUNTER — Ambulatory Visit: Payer: No Typology Code available for payment source | Admitting: Physician Assistant

## 2018-06-08 VITALS — BP 138/80 | HR 76 | Ht 62.0 in | Wt 237.4 lb

## 2018-06-08 DIAGNOSIS — Z8249 Family history of ischemic heart disease and other diseases of the circulatory system: Secondary | ICD-10-CM

## 2018-06-08 DIAGNOSIS — I10 Essential (primary) hypertension: Secondary | ICD-10-CM | POA: Diagnosis not present

## 2018-06-08 DIAGNOSIS — Z01810 Encounter for preprocedural cardiovascular examination: Secondary | ICD-10-CM | POA: Diagnosis not present

## 2018-06-08 NOTE — Progress Notes (Signed)
Cardiology Office Note:    Date:  06/08/2018   ID:  Alexis Stark, DOB 09/15/1965, MRN [id number]  PCP:  Doristine BosworthStallings, Zoe A, MD  Cardiologist:  Kristeen MissPhilip Nahser, MD   Electrophysiologist:  None   Referring MD: Doristine BosworthStallings, Zoe A, MD   Chief Complaint  Patient presents with  . Follow-up    HTN     History of Present Illness:    Alexis Stark is a 52 y.o. female with  type 2 diabetes, hyperlipidemia, morbid obesity, GERD, family history of aortic dissection.  She was initially evaluated in March 2019 for chest pain.  An echocardiogram and coronary CTA were both ordered.  However, the patient never had these done.  She returned for surgical clearance earlier this month (she needs dental work).  She was off blood pressure medication and her metformin for diabetes.  Her blood pressure was out of control and I placed her on Metoprolol Succinate 50 mg QD.     Alexis Stark returns for follow up.  She is here alone.  She has not had any chest pain, shortness of breath, syncope or leg swelling.  She does feel tired since she started on the Toprol XL.   Prior CV studies:   The following studies were reviewed today:  Echo 05/29/18 EF 55-60, no RWMA, Gr 1 DD  Past Medical History:  Diagnosis Date  . Dyslipidemia 12/09/2016  . Family history of aortic dissection 12/09/2016  . Gastroesophageal reflux disease 12/09/2016  . History of echocardiogram    Echo 12/19: EF 55-60, normal wall motion, grade 1 diastolic dysfunction  . Morbid obesity (HCC) 12/09/2016  . Type 2 diabetes mellitus without complication (HCC) 12/09/2016   Surgical Hx: The patient  has no past surgical history on file.   Current Medications: Current Meds  Medication Sig  . metoprolol succinate (TOPROL-XL) 50 MG 24 hr tablet Take 1 tablet (50 mg total) by mouth daily. Take with or immediately following a meal.  . omeprazole (PRILOSEC) 40 MG capsule Take 1 capsule (40 mg total) by mouth 2 (two) times daily.     Allergies:   Patient has  no known allergies.   Social History   Tobacco Use  . Smoking status: Never Smoker  . Smokeless tobacco: Never Used  Substance Use Topics  . Alcohol use: No    Alcohol/week: 0.0 standard drinks  . Drug use: No     Family Hx: The patient's family history includes Aortic dissection (age of onset: 3269) in her sister; COPD in her mother; Diabetes in her father and sister; Heart disease in her sister; Heart disease (age of onset: 4456) in her sister; Hypertension in her father, mother, and sister; Stroke in her father.  ROS:   Please see the history of present illness.    ROS All other systems reviewed and are negative.   EKGs/Labs/Other Test Reviewed:    EKG:  EKG is not ordered today.    Recent Labs: 08/25/2017: BUN 10; Creatinine, Ser 0.59; Potassium 4.4; Sodium 136   Recent Lipid Panel Lab Results  Component Value Date/Time   CHOL 245 (H) 05/11/2017 04:17 PM   TRIG 130 05/11/2017 04:17 PM   HDL 63 05/11/2017 04:17 PM   CHOLHDL 3.9 05/11/2017 04:17 PM   CHOLHDL 4.3 05/12/2016 05:43 PM   LDLCALC 156 (H) 05/11/2017 04:17 PM    Physical Exam:    VS:  BP 138/80   Pulse 76   Ht 5\' 2"  (1.575 m)   Wt 237 lb  6.4 oz (107.7 kg)   SpO2 95%   BMI 43.42 kg/m     Wt Readings from Last 3 Encounters:  06/08/18 237 lb 6.4 oz (107.7 kg)  05/23/18 234 lb 6.4 oz (106.3 kg)  08/25/17 234 lb 12.8 oz (106.5 kg)     Physical Exam  Constitutional: She is oriented to person, place, and time. She appears well-developed and well-nourished. No distress.  HENT:  Head: Normocephalic and atraumatic.  Neck: No JVD present.  Cardiovascular: Normal rate and regular rhythm.  No murmur heard. Pulmonary/Chest: Effort normal. She has no rales.  Abdominal: Soft.  Musculoskeletal:        General: No edema.  Neurological: She is alert and oriented to person, place, and time.  Skin: Skin is warm and dry.    ASSESSMENT & PLAN:    Essential hypertension BP is improved but not to target yet.  I  have encouraged her to increase activity, work on weight loss and limit salt.  She may be having some intolerance to beta-blocker with fatigue.  If this continues, we can try to switch back to an ACE inhibitor (Lisinopril) or an angiotensin receptor blocker (Losartan).    Preoperative cardiovascular examination She needs dental work.  This is a low risk procedure and her blood pressure is better controlled. She may proceed with her dental work at acceptable risk.  Family history of aortic dissection   Coronary CTA is still pending. We will check on getting it scheduled.  If she has any evidence of thoracic aortic aneurysm, it would be nice to keep her on a beta-blocker if we can.   Dispo:  Return in about 6 months (around 12/08/2018) for Routine Follow Up, w/ Dr. Elease HashimotoNahser, or Tereso NewcomerScott Weaver, PA-C.   Medication Adjustments/Labs and Tests Ordered: Current medicines are reviewed at length with the patient today.  Concerns regarding medicines are outlined above.  Tests Ordered: No orders of the defined types were placed in this encounter.  Medication Changes: No orders of the defined types were placed in this encounter.   Signed, Tereso NewcomerScott Weaver, PA-C  06/08/2018 1:50 PM    [facility] [address], [place], [place]  [address] Phone: [phone]; Fax: [phone]

## 2018-06-08 NOTE — Patient Instructions (Signed)
Medication Instructions:  Continue your current medications. If you continue to have fatigue, let me know and we can change the Toprol to something else.  If you need a refill on your cardiac medications before your next appointment, please call your pharmacy.   Lab work: None   If you have labs (blood work) drawn today and your tests are completely normal, you will receive your results only by: Marland Kitchen. MyChart Message (if you have MyChart) OR . A paper copy in the mail If you have any lab test that is abnormal or we need to change your treatment, we will call you to review the results.  Testing/Procedures: We will check on getting your CT scheduled.    Follow-Up: At [facility], you and your health needs are our priority.  As part of our continuing mission to provide you with exceptional heart care, we have created designated Provider Care Teams.  These Care Teams include your primary Cardiologist (physician) and Advanced Practice Providers (APPs -  Physician Assistants and Nurse Practitioners) who all work together to provide you with the care you need, when you need it. You will need a follow up appointment in:  6 months.  Please call our office 2 months in advance to schedule this appointment.  You may see Kristeen MissPhilip Nahser, MD or one of the following Advanced Practice Providers on your designated Care Team: Tereso NewcomerScott Darald Uzzle, PA-C   Any Other Special Instructions Will Be Listed Below (If Applicable). Keep an eye on your blood pressure. If you constantly get readings of 140/90 or higher let us know. Try to limit your salt. Increase activity (walk 30 minutes a day) and work on weight loss to help with your blood pressure. Have your family let you know if you snore loudly or if they see you stop breathing when you are sleeping.  If they see this, you may have sleep apnea.  Let us know if you have this and we can order a test to evaluate it.

## 2018-06-20 ENCOUNTER — Other Ambulatory Visit: Payer: Self-pay

## 2018-06-20 DIAGNOSIS — I10 Essential (primary) hypertension: Secondary | ICD-10-CM

## 2018-06-20 DIAGNOSIS — Z8249 Family history of ischemic heart disease and other diseases of the circulatory system: Secondary | ICD-10-CM

## 2018-06-20 DIAGNOSIS — Z01810 Encounter for preprocedural cardiovascular examination: Secondary | ICD-10-CM

## 2018-06-21 ENCOUNTER — Other Ambulatory Visit: Payer: No Typology Code available for payment source | Admitting: *Deleted

## 2018-06-21 DIAGNOSIS — I10 Essential (primary) hypertension: Secondary | ICD-10-CM

## 2018-06-21 DIAGNOSIS — Z8249 Family history of ischemic heart disease and other diseases of the circulatory system: Secondary | ICD-10-CM

## 2018-06-21 DIAGNOSIS — Z01810 Encounter for preprocedural cardiovascular examination: Secondary | ICD-10-CM

## 2018-06-22 LAB — BASIC METABOLIC PANEL
BUN/Creatinine Ratio: 20 (ref 9–23)
BUN: 12 mg/dL (ref 6–24)
CO2: 24 mmol/L (ref 20–29)
Calcium: 9 mg/dL (ref 8.7–10.2)
Chloride: 105 mmol/L (ref 96–106)
Creatinine, Ser: 0.61 mg/dL (ref 0.57–1.00)
GFR calc Af Amer: 120 mL/min/{1.73_m2} (ref >59)
GFR calc non Af Amer: 104 mL/min/{1.73_m2} (ref >59)
Glucose: 49 mg/dL — ABNORMAL LOW (ref 65–99)
Potassium: 3.7 mmol/L (ref 3.5–5.2)
Sodium: 143 mmol/L (ref 134–144)

## 2018-07-04 ENCOUNTER — Telehealth (HOSPITAL_COMMUNITY): Payer: Self-pay | Admitting: Emergency Medicine

## 2018-07-04 NOTE — Telephone Encounter (Signed)
Left message on voicemail with name and callback number Baltazar Pekala RN Navigator Cardiac Imaging [facility] [phone] Office [phone] Cell  

## 2018-07-05 ENCOUNTER — Ambulatory Visit (HOSPITAL_COMMUNITY)
Admission: RE | Admit: 2018-07-05 | Discharge: 2018-07-05 | Disposition: A | Discharge status: Home / Self Care | Payer: No Typology Code available for payment source | Source: Ambulatory Visit | Attending: Physician Assistant | Admitting: Physician Assistant

## 2018-07-05 ENCOUNTER — Telehealth (HOSPITAL_COMMUNITY): Payer: Self-pay | Admitting: Emergency Medicine

## 2018-07-05 DIAGNOSIS — R0789 Other chest pain: Secondary | ICD-10-CM | POA: Diagnosis not present

## 2018-07-05 DIAGNOSIS — Z8249 Family history of ischemic heart disease and other diseases of the circulatory system: Secondary | ICD-10-CM | POA: Diagnosis present

## 2018-07-05 MED ORDER — METOPROLOL TARTRATE 5 MG/5ML IV SOLN
INTRAVENOUS | Status: AC
  Administered 2018-07-05: 5 mg via INTRAVENOUS
  Filled 2018-07-05: qty 5

## 2018-07-05 MED ORDER — NITROGLYCERIN 0.4 MG SL SUBL
SUBLINGUAL_TABLET | SUBLINGUAL | Status: AC
  Filled 2018-07-05: qty 2

## 2018-07-05 MED ORDER — NITROGLYCERIN 0.4 MG SL SUBL
0.8000 mg | SUBLINGUAL_TABLET | Freq: Once | SUBLINGUAL | Status: AC
  Administered 2018-07-05: 0.8 mg via SUBLINGUAL
  Filled 2018-07-05: qty 25

## 2018-07-05 MED ORDER — METOPROLOL TARTRATE 5 MG/5ML IV SOLN
5.0000 mg | INTRAVENOUS | Status: DC | PRN
  Administered 2018-07-05: 5 mg via INTRAVENOUS

## 2018-07-05 NOTE — Telephone Encounter (Signed)
Left message on voicemail with name and callback number Rockwell Alexandria RN Navigator Cardiac Imaging [facility] [phone] Office [phone] Cell   Attempting to reschedule appt for coronary CTA

## 2018-07-05 NOTE — Progress Notes (Signed)
Patient IV infiltrated during CT scan.  IV removed.  Ice applied.

## 2018-07-05 NOTE — Progress Notes (Signed)
Attempted IV start by Alphonzo Lemmings, RN to repeat CT.  Unsuccessful.  Patient will need IV start by IV team or IV start by IR for future visit.

## 2018-07-06 ENCOUNTER — Telehealth: Payer: Self-pay

## 2018-07-06 NOTE — Telephone Encounter (Signed)
LMOM foor pt to call our office back to reschedule her for a CT.

## 2018-07-06 NOTE — Telephone Encounter (Signed)
-----   Message from Beatrice Lecher, [place] sent at 07/06/2018  1:27 PM EST ----- Regarding: FW: coronary CTA Marcelle Smiling -  See below.  Can you follow up with Ms Toki to reschedule her CT?  Please send reply to Huntley Dec as well to keep her informed.  Thanks Acupuncturist ----- Message ----- From: Lennie Odor, RN Sent: 07/05/2018   3:31 PM EST To: Beatrice Lecher, PA-C Subject: coronary CTA                                   Lorin Picket, My name is Huntley Dec and I am the new navigator for cardiac imaging. This patient came in this morning for her coronary CTA however had some difficulty with starting a reliable IV. This patient was sent home and I want to reschedule. Attempted to call her this afternoon to find a good time for a new appt but left a voicemail.  If you could reach out to her that would be great! Thank you Rockwell Alexandria RN Navigator Cardiac Imaging [facility] [phone] Office  [phone] Cell

## 2018-07-12 MED ORDER — IOPAMIDOL (ISOVUE-370) INJECTION 76%
80.0000 mL | Freq: Once | INTRAVENOUS | Status: AC | PRN
  Administered 2018-07-12: 80 mL via INTRAVENOUS

## 2019-05-11 IMAGING — CT CT HEART MORP W/ CTA COR W/ SCORE W/ CA W/CM &/OR W/O CM
4 of 7 series · 8 of 20 positions shown, 9 images · non-contrast
Comparison: None.

EXAM:
OVER-READ INTERPRETATION  CT CHEST

The following report is an over-read performed by radiologist Dr.
does not include interpretation of cardiac or coronary anatomy or
pathology. The cardiac/coronary CT interpretation by the
cardiologist is attached.

[Series 7: best diast 75 % · axial · 0.39mm/px · z∈[+1303,+1344]mm · 2 of 313 slices shown, 3 images]
[im 105/313  vessel]
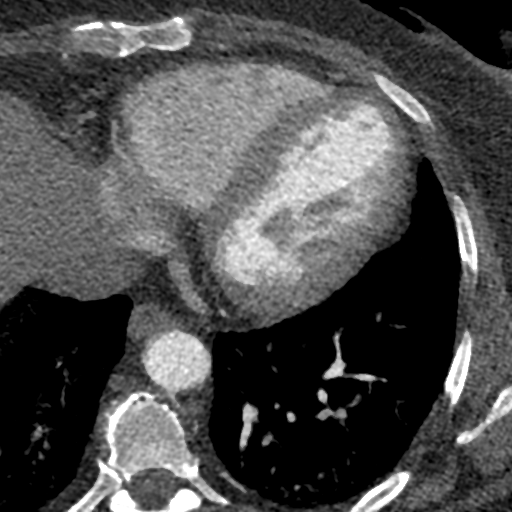
[im 105/313  lung]
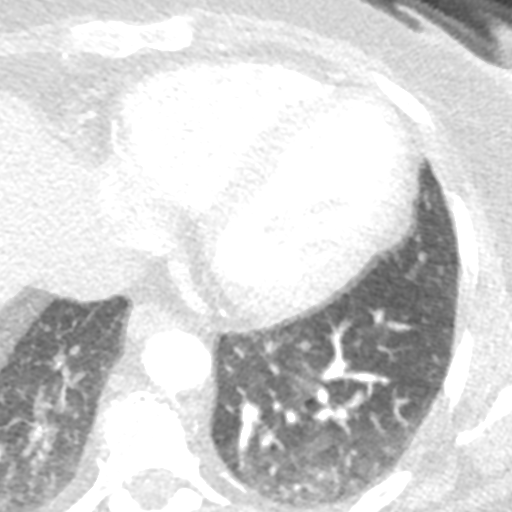
[im 209/313  vessel]
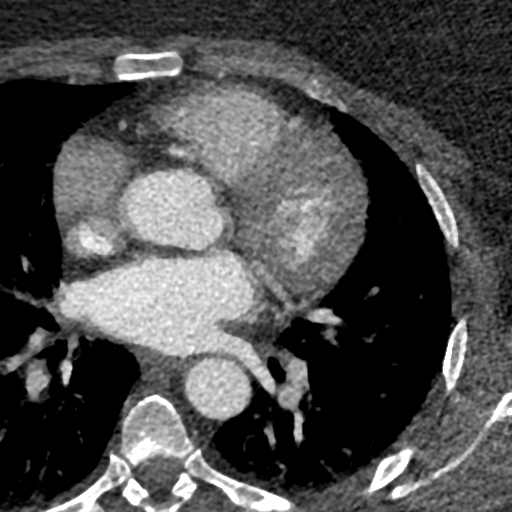

[Series 8: best syst 43 % · axial · 0.39mm/px · z∈[+1303,+1344]mm · 2 of 313 slices shown]
[im 105/313  vessel]
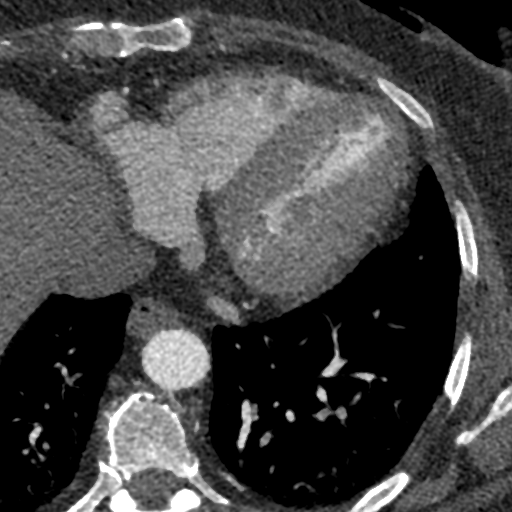
[im 209/313  vessel]
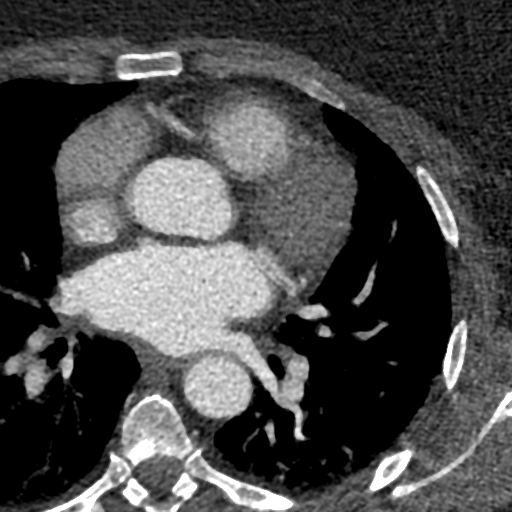

[Series 9: ts diast sharp 75 % · axial · 0.39mm/px · z∈[+1303,+1344]mm · 2 of 313 slices shown]
[im 105/313  lung]
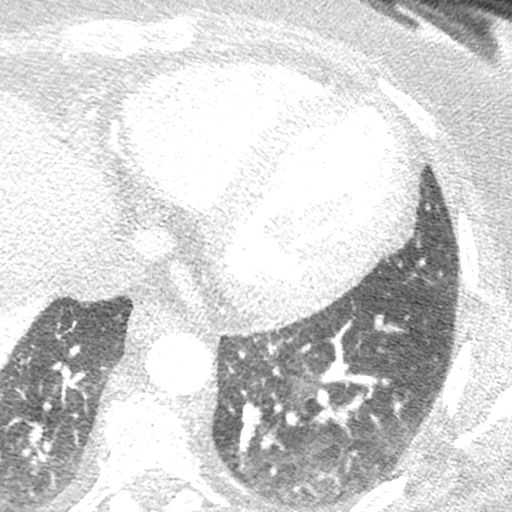
[im 209/313  lung]
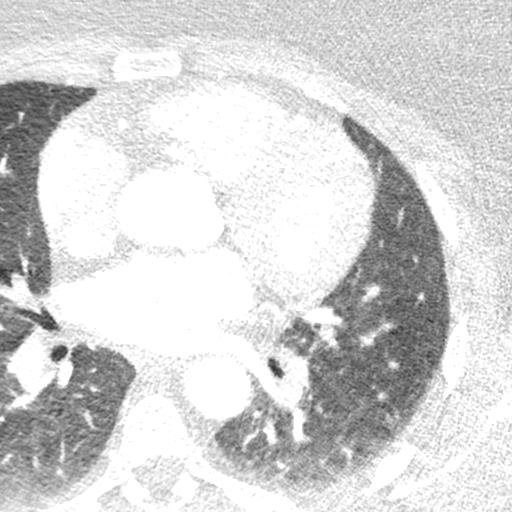

[Series 10: ts syst sharp 43 % · axial · 0.39mm/px · z∈[+1303,+1344]mm · 2 of 313 slices shown]
[im 105/313  lung]
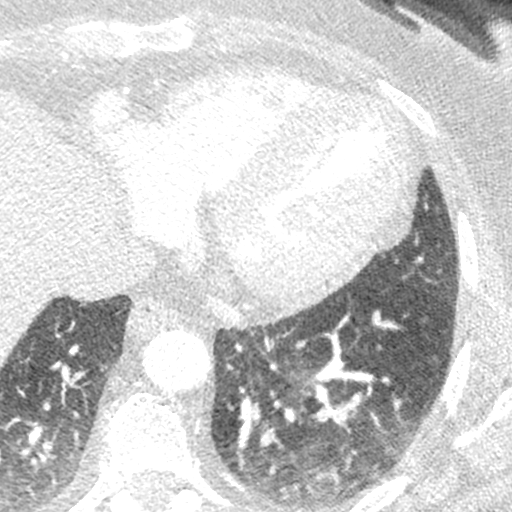
[im 209/313  lung]
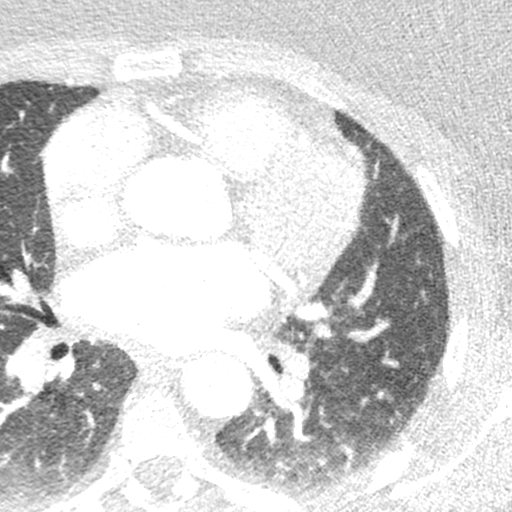

[8 of 20 positions shown; findings below may reference images not displayed]

FINDINGS: The visualized portions of the lower lung fields show no suspicious
nodules, masses, or infiltrates. The visualized portions of the
mediastinum and chest wall are unremarkable.
IMPRESSION: No significant non-cardiac abnormality in visualized portion of the
thorax.
